# Patient Record
Sex: Male | Born: 1935 | Race: White | Hispanic: No | Smoking: Never smoker
Health system: Southern US, Community
[De-identification: ages and names within clinical notes are randomized; demographics above are authoritative.]

## PROBLEM LIST (undated history)

## (undated) DIAGNOSIS — H547 Unspecified visual loss: Secondary | ICD-10-CM

## (undated) DIAGNOSIS — I639 Cerebral infarction, unspecified: Secondary | ICD-10-CM

## (undated) DIAGNOSIS — T8859XA Other complications of anesthesia, initial encounter: Secondary | ICD-10-CM

## (undated) DIAGNOSIS — I10 Essential (primary) hypertension: Secondary | ICD-10-CM

## (undated) DIAGNOSIS — F419 Anxiety disorder, unspecified: Secondary | ICD-10-CM

## (undated) DIAGNOSIS — R739 Hyperglycemia, unspecified: Secondary | ICD-10-CM

## (undated) DIAGNOSIS — T4145XA Adverse effect of unspecified anesthetic, initial encounter: Secondary | ICD-10-CM

## (undated) HISTORY — DX: Unspecified visual loss: H54.7

## (undated) HISTORY — DX: Hyperglycemia, unspecified: R73.9

## (undated) HISTORY — PX: COLONOSCOPY W/ POLYPECTOMY: SHX1380

## (undated) HISTORY — DX: Cerebral infarction, unspecified: I63.9

## (undated) HISTORY — PX: VASECTOMY: SHX75

## (undated) HISTORY — DX: Essential (primary) hypertension: I10

## (undated) HISTORY — PX: INGUINAL HERNIA REPAIR: SUR1180

## (undated) HISTORY — DX: Anxiety disorder, unspecified: F41.9

---

## 1999-05-10 DIAGNOSIS — I639 Cerebral infarction, unspecified: Secondary | ICD-10-CM

## 1999-05-10 HISTORY — DX: Cerebral infarction, unspecified: I63.9

## 2000-03-04 ENCOUNTER — Inpatient Hospital Stay (HOSPITAL_COMMUNITY): Admission: EM | Admit: 2000-03-04 | Discharge: 2000-03-08 | Payer: Self-pay | Admitting: Emergency Medicine

## 2000-03-04 ENCOUNTER — Encounter: Payer: Self-pay | Admitting: Neurology

## 2000-03-08 ENCOUNTER — Inpatient Hospital Stay (HOSPITAL_COMMUNITY)
Admission: RE | Admit: 2000-03-08 | Discharge: 2000-03-21 | Payer: Self-pay | Admitting: Physical Medicine & Rehabilitation

## 2006-08-10 ENCOUNTER — Ambulatory Visit (HOSPITAL_COMMUNITY): Admission: RE | Admit: 2006-08-10 | Discharge: 2006-08-10 | Payer: Self-pay | Admitting: Pulmonary Disease

## 2007-10-17 ENCOUNTER — Emergency Department (HOSPITAL_COMMUNITY): Admission: EM | Admit: 2007-10-17 | Discharge: 2007-10-17 | Payer: Self-pay | Admitting: Emergency Medicine

## 2008-09-05 ENCOUNTER — Emergency Department (HOSPITAL_COMMUNITY): Admission: EM | Admit: 2008-09-05 | Discharge: 2008-09-05 | Payer: Self-pay | Admitting: Emergency Medicine

## 2009-03-17 DIAGNOSIS — I6350 Cerebral infarction due to unspecified occlusion or stenosis of unspecified cerebral artery: Secondary | ICD-10-CM | POA: Insufficient documentation

## 2009-03-17 DIAGNOSIS — R7309 Other abnormal glucose: Secondary | ICD-10-CM | POA: Insufficient documentation

## 2009-03-17 DIAGNOSIS — I10 Essential (primary) hypertension: Secondary | ICD-10-CM | POA: Insufficient documentation

## 2009-03-19 ENCOUNTER — Encounter (INDEPENDENT_AMBULATORY_CARE_PROVIDER_SITE_OTHER): Payer: Self-pay | Admitting: *Deleted

## 2009-03-19 ENCOUNTER — Ambulatory Visit: Payer: Self-pay | Admitting: Cardiology

## 2009-03-19 DIAGNOSIS — F411 Generalized anxiety disorder: Secondary | ICD-10-CM | POA: Insufficient documentation

## 2009-03-19 DIAGNOSIS — R079 Chest pain, unspecified: Secondary | ICD-10-CM | POA: Insufficient documentation

## 2009-03-19 LAB — CONVERTED CEMR LAB
ALT: 12 units/L
BUN: 13 mg/dL
Calcium: 8.8 mg/dL
Cholesterol: 106 mg/dL
Creatinine, Ser: 0.81 mg/dL
GFR calc non Af Amer: >60 mL/min
Glomerular Filtration Rate, Af Am: >60 mL/min/{1.73_m2}
Glucose, Bld: 104 mg/dL
HCT: 45.1 %
HDL: 33 mg/dL
MCV: 93.2 fL
Platelets: 145 10*3/uL
Potassium: 4.2 meq/L
Triglycerides: 54 mg/dL

## 2009-03-26 ENCOUNTER — Encounter (HOSPITAL_COMMUNITY): Admission: RE | Admit: 2009-03-26 | Discharge: 2009-04-25 | Payer: Self-pay | Admitting: Cardiology

## 2009-03-26 ENCOUNTER — Encounter: Payer: Self-pay | Admitting: Cardiology

## 2009-03-26 ENCOUNTER — Ambulatory Visit: Payer: Self-pay | Admitting: Cardiology

## 2009-04-07 ENCOUNTER — Encounter (INDEPENDENT_AMBULATORY_CARE_PROVIDER_SITE_OTHER): Payer: Self-pay | Admitting: *Deleted

## 2009-04-07 ENCOUNTER — Ambulatory Visit: Payer: Self-pay | Admitting: Cardiology

## 2009-05-04 ENCOUNTER — Encounter: Payer: Self-pay | Admitting: Cardiology

## 2009-05-04 LAB — CONVERTED CEMR LAB
CO2: 26 meq/L (ref 19–32)
Calcium: 9.6 mg/dL (ref 8.4–10.5)
Chloride: 106 meq/L (ref 96–112)
Glucose, Bld: 114 mg/dL — ABNORMAL HIGH (ref 70–99)
Potassium: 4.1 meq/L (ref 3.5–5.3)
Sodium: 144 meq/L (ref 135–145)

## 2009-05-05 ENCOUNTER — Encounter: Payer: Self-pay | Admitting: Cardiology

## 2010-08-18 LAB — COMPREHENSIVE METABOLIC PANEL
Alkaline Phosphatase: 109 U/L (ref 39–117)
BUN: 14 mg/dL (ref 6–23)
Chloride: 108 mEq/L (ref 96–112)
Creatinine, Ser: 0.94 mg/dL (ref 0.4–1.5)
Glucose, Bld: 150 mg/dL — ABNORMAL HIGH (ref 70–99)
Potassium: 3.2 mEq/L — ABNORMAL LOW (ref 3.5–5.1)
Total Bilirubin: 0.6 mg/dL (ref 0.3–1.2)
Total Protein: 6.9 g/dL (ref 6.0–8.3)

## 2010-08-18 LAB — CBC
HCT: 44.7 % (ref 39.0–52.0)
Hemoglobin: 15 g/dL (ref 13.0–17.0)
MCV: 93 fL (ref 78.0–100.0)
Platelets: 145 10*3/uL — ABNORMAL LOW (ref 150–400)
RDW: 13.2 % (ref 11.5–15.5)

## 2010-08-18 LAB — DIFFERENTIAL
Basophils Absolute: 0 10*3/uL (ref 0.0–0.1)
Basophils Relative: 0 % (ref 0–1)
Lymphocytes Relative: 40 % (ref 12–46)
Neutro Abs: 3.9 10*3/uL (ref 1.7–7.7)
Neutrophils Relative %: 48 % (ref 43–77)

## 2010-08-18 LAB — POCT CARDIAC MARKERS: Myoglobin, poc: 44.5 ng/mL (ref 12–200)

## 2010-08-18 LAB — PROTIME-INR: INR: 1.1 (ref 0.00–1.49)

## 2010-09-24 NOTE — Discharge Summary (Signed)
[facility]  Patient:    Ronald Bruce, Ronald Bruce                       MRN: [id number] Adm. Date:  [id number] Disc. Date: 03/08/00 Attending:  Erich Montane CC:         Rande Brunt. Thomasena Edis, M.D.   Discharge Summary  For complete details of chief complaint, history of present illness, past medical history and physical examination, please refer to Dr. Dahlia Bailiff admission History and Physical.  HISTORY OF PRESENT ILLNESS:  Ronald Bruce is a 75 year old, right-handed, married male from [place], [place].  He has at least a three-year history of hypertension intermittently treated in the past, but the patient is not currently on any antihypertensives.  He presented with a two-day history of headaches and awoke on the morning of admission because of difficulty with snoring.  He tried to get out of bed and slid to the floor.  He was unable to get off of the floor.  He was taken to the [facility] Emergency Room where he was noticed to have right-sided weakness.  CT scan of the brain initially was normal.  The patient was transferred to [facility] for admission to the stroke service.  PAST MEDICAL HISTORY:  Positive for colon polyps removed x 2 by Dr. Victorino Dike in 1994 and 1996.  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  The patient does not smoke cigarettes.  He does drink some alcohol admitting to about three beers per day.  PHYSICAL EXAMINATION:  VITAL SIGNS:  Blood pressure 180/80, heart rate 75.  NEUROLOGIC:  The patient was alert and oriented.  He followed commands.  THere was no denial of the left side or aphasia.  Full visual fields were noted. This was present in the left eye as he has congenital blindness in the right eye.  There is some droop of the left corner of his mouth versus the right. He had some element of dysarthria.   Tongue deviated to the left.  Motor examination revealed 3/5 weakness in the left arm with 4+/5 strength in the left leg.  Sensory examination was equal bilaterally.  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  He had some evidence of hypertension.  The plan was to admit the patient and place him on heparin, obtain MRI of the brain with MR angiography and 2D echocardiogram.  LABORATORY DATA AND X-RAY FINDINGS:  Admission CBC and differential showed a hemoglobin of 13.9 and hematocrit of 39.6, white blood cell count 5.2 and platelets 151,000, 77 neutrophils, 14 lymphs, 4 monos.  The most recent CBC on October 30, showed hemoglobin 13.4 and hematocrit of 38.3, white count 4.0 and platelets 153,000.  Sedimentation rate was 2 mm per hour.  PTTs were generally within the therapeutic range.  Comprehensive metabolic panel on admission showed a potassium of 3.4, glucose 118, calcium 8.1.  Total protein 5.6 and all other parameters were within normal limits.  Subsequent basic metabolic panel on October 29, showed potassium 3.3, glucose 155.  Serial CPKs were normal at 27, 35 and 30 units/L, CK-MBs negative and troponin I was less than 0.01.  Lipid profile showed a cholesterol of 126, triglycerides 432 and HDL cholesterol of 19 which was low.  Total cholesterol/HDL ratio was  6.6. Urinalysis was unremarkable.  RPR nonreactive.  Chest x-ray on October 27, showed cardiomegaly with no active disease.  MRI of the brain on October 27, showed an acute moderate size nonhemorrhagic infarct involving the posterior superior aspect of the right lenticular nucleus in the mid to posterior aspect of the right corona radiata.  There was baseline small vessel disease type changes which were mild to moderate in degree.  MR angiography showed no evidence of hemodynamically significant stenosis involving either carotid  bifurcation.  Intracranial MRA showed a poor quality study, most likely related to the patients dental work.  There was apparent narrowing along the genu just proximal to the horizontal segment of the internal carotid artery bilaterally, question artifact.  There was other evidence suggesting mild intracranial arteriosclerotic disease.  HOSPITAL COURSE:  The patient was admitted to the neurologic science unit. Initially, blood pressures were somewhat elevated in the range of 180-185 systolic.  These gradually came down within the range of 130-160 systolic with diastolics of 75-90.  The patient remained febrile.  His p.o. intake was normal on a regular diet.  The patient was started on heparin and continued on heparin throughout this brief hospitalization.  He underwent the above-mentioned studies without difficulty.  A 2D echocardiogram was obtained, but results are pending at this time.  Clinically, the patient remained alert and oriented.  His speech was essentially normal with slight dysarthria.  He seemed to have some increase in weakness in the left upper extremity initially following admission and at that time of this dictation has 2-3/5 weakness proximally in the left upper extremity with 0-2/5 strength distally.  There is 4-4+/5 weakness involving his left lower extremity.  He is normal on the right side.  The patient was up ambulating, but did in fact fall at one point without injury.  He was seen by the stroke team and felt to have no swallowing difficulties per speech therapy.  He has been evaluated by physical therapy.  He is felt to be an excellent rehabilitation candidate and was seen by Dr. Thomasena Edis in rehabilitation consultation.  Dr. Thomasena Edis agreed that inpatient rehabilitation would be helpful.  On October 31, a bed became available on rehabilitation unit.  As noted above, the patient remains on heparin pending the results of his 2D echocardiogram.  If this study is  negative for an embolic source, the patient most likely would best be switched to aspirin therapy.  DISCHARGE DIAGNOSES:  1. Acute right brain subcortical cerebrovascular accident. 2. Hypertension, fairly well controlled off of medication. 3. History of colon polyps, removed x 2.  DISCHARGE MEDICATIONS: 1. Pepcid 20 mg IV q.12h. 2. Heparin therapy.  DISPOSITION:  The patient is transferred to the inpatient rehabilitation unit.  CONDITION ON DISCHARGE:  Stable.  PROGNOSIS:  Fair to good. DD:  03/08/00 TD:  03/08/00 Job: [id number]

## 2010-09-24 NOTE — Discharge Summary (Signed)
[place]  Patient:    Ronald Bruce, Ronald Bruce                       MRN: [id number] Adm. Date:  [id number] Disc. Date: [id number] Attending:  Herold Harms Dictator:   Dian Situ, PA CC:         Patrica Duel, M.D.  Genene Churn. Love, M.D.   Discharge Summary  DISCHARGE DIAGNOSES: 1. Status post cerebrovascular accident. 2. Hypertension. 3. Abnormal liver function tests, resolved. 4. Hyperglycemia, initially resolved.  HISTORY OF PRESENT ILLNESS:  Mr. Ronald Bruce is a 75 year old male with a history of congenital blindness, OD, and hypertension.  He quit medicines years ago.  Admitted with a two-day history of headache and difficulty with ambulation and falls.  He was initially admitted to [facility] on March 04, 2000, and transferred to [facility] for further treatment.  MRI and MRA showed acute infarct in the right lenticular and inferior right corona radiata.  Currently the patient remains on heparin q.d.  Echocardiogram negative for thrombus.  Neurology recommends aspirin alone for CVA prophylaxis.  A bedside swallow showed no signs of dysphagia.  The patient is on a regular diet with thin liquids.  He is current minimal assist for transfers, minimal assist to ambulate 30 feet with queues.  PAST MEDICAL HISTORY:  Significant for a history of colon polyps, blindness in the right eye, vasectomy, right inguinal hernia, and hypertension.  ALLERGIES:  No known drug allergies.  SOCIAL HISTORY:  The patient is married.  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  He does not use any tobacco.  He drinks at least a six-pack of beer a day.  HOSPITAL COURSE:  Mr. Ronald Bruce was admitted to rehabilitation on March 08, 2000, for inpatient therapies to consist of PT and OT daily.  Post admission, he has been maintained on Coumadin for CVA prophylaxis.  Blood pressures were  monitored on a b.i.d. basis and were noted to be ranging upwards.  Therefore, he was started on low-dose Toprol.  At the time of discharge, the blood pressure was reasonably controlled, ranging from 120s-140 systolic and 70s-80s diastolic.  Labs were checked at admission and the patient was noted to have some elevation of AST to 48 and ALT to 61.  Recheck showed resolution.  Check of labs on March 13, 2000, showed sodium 141, potassium 3.7, chloride 106, CO2 29, BUN 11, creatinine 0.8, glucose 128, AST 18, and ALT 38.  CBGs were checked on a q.i.d. basis initially secondary to some mild elevation on early a.m. blood sugars.  He was noted to have an occasional high to the 130s.  A hemoglobin A1C was checked and was noted to be below normal range at 3.4.  CBG checks were discontinued.  Currently the patient is on a regular diet.  The patients left lower extremity is somewhat improved in strength.  The left lower extremity remains pruritus.  The patient is aware of restrictions for driving and hunting post discharge.  By the time of discharge, he has progressed to being at supervision level for ADL needs. He is a supervision for transfers and supervision for ambulating 200+ feet without an assistive device.  He is able to climb five stairs with minimal assist at supervision level.  The family is to provide 24-hour supervision. The patient is to continue with progressive therapies, including PT and OT at  the [facility] post discharge.  On March 21, 2000, the patient is discharged to home.  DISCHARGE MEDICATIONS: 1. Coated 325 mg per day. 2. Tenormin 25 mg per day. 3. Multivitamins one per day.  ACTIVITY:  24-hour supervision.  DIET:  Regular.  SPECIAL INSTRUCTIONS:  No alcohol, no smoking, and no driving.  FOLLOW-UP:  The patient is to follow up with Reuel Boom L. Thomasena Edis, M.D., in one month for recheck.  Follow up with Genene Churn. Love, M.D., and Patrica Duel, M.D.,  in three to four weeks for recheck. DD:  03/21/00 TD:  03/21/00 Job: [id number]

## 2011-02-03 LAB — CBC
MCHC: 35
MCV: 90.3
Platelets: 128 — ABNORMAL LOW
RDW: 13.2

## 2011-02-03 LAB — BASIC METABOLIC PANEL
BUN: 10
CO2: 27
Chloride: 108
Creatinine, Ser: 1.05
Glucose, Bld: 172 — ABNORMAL HIGH

## 2011-02-03 LAB — DIFFERENTIAL
Basophils Absolute: 0
Basophils Relative: 0
Monocytes Absolute: 0.4
Monocytes Relative: 9
Neutrophils Relative %: 65

## 2011-02-03 LAB — POCT CARDIAC MARKERS
Myoglobin, poc: 58.1
Operator id: 213931

## 2011-04-18 ENCOUNTER — Encounter: Payer: Self-pay | Admitting: Cardiology

## 2012-11-05 ENCOUNTER — Emergency Department (HOSPITAL_COMMUNITY): Payer: Medicare Other

## 2012-11-05 ENCOUNTER — Encounter (HOSPITAL_COMMUNITY): Payer: Self-pay | Admitting: *Deleted

## 2012-11-05 ENCOUNTER — Emergency Department (HOSPITAL_COMMUNITY)
Admission: EM | Admit: 2012-11-05 | Discharge: 2012-11-05 | Disposition: A | Discharge status: Home / Self Care | Payer: Medicare Other | Attending: Emergency Medicine | Admitting: Emergency Medicine

## 2012-11-05 DIAGNOSIS — R6883 Chills (without fever): Secondary | ICD-10-CM | POA: Insufficient documentation

## 2012-11-05 DIAGNOSIS — F411 Generalized anxiety disorder: Secondary | ICD-10-CM | POA: Insufficient documentation

## 2012-11-05 DIAGNOSIS — Y9289 Other specified places as the place of occurrence of the external cause: Secondary | ICD-10-CM | POA: Insufficient documentation

## 2012-11-05 DIAGNOSIS — S20219A Contusion of unspecified front wall of thorax, initial encounter: Secondary | ICD-10-CM | POA: Insufficient documentation

## 2012-11-05 DIAGNOSIS — W010XXA Fall on same level from slipping, tripping and stumbling without subsequent striking against object, initial encounter: Secondary | ICD-10-CM | POA: Insufficient documentation

## 2012-11-05 DIAGNOSIS — Z862 Personal history of diseases of the blood and blood-forming organs and certain disorders involving the immune mechanism: Secondary | ICD-10-CM | POA: Insufficient documentation

## 2012-11-05 DIAGNOSIS — H5359 Other color vision deficiencies: Secondary | ICD-10-CM | POA: Insufficient documentation

## 2012-11-05 DIAGNOSIS — Z79899 Other long term (current) drug therapy: Secondary | ICD-10-CM | POA: Insufficient documentation

## 2012-11-05 DIAGNOSIS — Y9389 Activity, other specified: Secondary | ICD-10-CM | POA: Insufficient documentation

## 2012-11-05 DIAGNOSIS — Z7982 Long term (current) use of aspirin: Secondary | ICD-10-CM | POA: Insufficient documentation

## 2012-11-05 DIAGNOSIS — Z8673 Personal history of transient ischemic attack (TIA), and cerebral infarction without residual deficits: Secondary | ICD-10-CM | POA: Insufficient documentation

## 2012-11-05 DIAGNOSIS — W19XXXA Unspecified fall, initial encounter: Secondary | ICD-10-CM

## 2012-11-05 DIAGNOSIS — R0602 Shortness of breath: Secondary | ICD-10-CM | POA: Insufficient documentation

## 2012-11-05 DIAGNOSIS — S59909A Unspecified injury of unspecified elbow, initial encounter: Secondary | ICD-10-CM | POA: Insufficient documentation

## 2012-11-05 DIAGNOSIS — I10 Essential (primary) hypertension: Secondary | ICD-10-CM | POA: Insufficient documentation

## 2012-11-05 DIAGNOSIS — Z7902 Long term (current) use of antithrombotics/antiplatelets: Secondary | ICD-10-CM | POA: Insufficient documentation

## 2012-11-05 DIAGNOSIS — M549 Dorsalgia, unspecified: Secondary | ICD-10-CM | POA: Insufficient documentation

## 2012-11-05 DIAGNOSIS — Z8639 Personal history of other endocrine, nutritional and metabolic disease: Secondary | ICD-10-CM | POA: Insufficient documentation

## 2012-11-05 DIAGNOSIS — IMO0002 Reserved for concepts with insufficient information to code with codable children: Secondary | ICD-10-CM | POA: Insufficient documentation

## 2012-11-05 DIAGNOSIS — S6990XA Unspecified injury of unspecified wrist, hand and finger(s), initial encounter: Secondary | ICD-10-CM | POA: Insufficient documentation

## 2012-11-05 DIAGNOSIS — T07XXXA Unspecified multiple injuries, initial encounter: Secondary | ICD-10-CM

## 2012-11-05 DIAGNOSIS — T148XXA Other injury of unspecified body region, initial encounter: Secondary | ICD-10-CM

## 2012-11-05 MED ORDER — TRAMADOL HCL 50 MG PO TABS: 50.0000 mg | ORAL_TABLET | Freq: Four times a day (QID) | ORAL | Status: DC | PRN

## 2012-11-05 NOTE — ED Provider Notes (Signed)
History    This chart was scribed for Shelda Jakes, MD by Donne Anon, ED Scribe. This patient was seen in room APA14/APA14 and the patient's care was started at 1315.  CSN: [id number] Arrival date & time 11/05/12  1222  First MD Initiated Contact with Patient 11/05/12 1315     Chief Complaint  Patient presents with  . Fall    Patient is a 77 y.o. male presenting with fall. The history is provided by the patient and the spouse. No language interpreter was used.  Fall This is a new problem. The current episode started 1 to 2 hours ago. The problem occurs constantly. The problem has not changed since onset.Associated symptoms include chest pain and shortness of breath. Pertinent negatives include no abdominal pain and no headaches. He has tried nothing for the symptoms.   HPI Comments: Ronald Bruce is a 77 y.o. male who presents to the Emergency Department complaining of a fall which occurred 2 hours ago. He was walking, tripped over an object on the ground, extended his left arm to brace himself and landed on a rock on his left hand and left chest with his face landing in a bush. He was dazed and confused after the fall, and reports questionable LOC after he hit the ground. He currently complains of left hand pain, left chest pain that worsens with deep breaths (rated 0/10 at rest, 5/10 with breathing) and right arm bruising. He denies leg pain, neck pain, nausea, vomiting, SOB and fever. He reports residual left sided weakness due to a CVA in 2001.   His PCP is Dr. Margo Aye.  Past Medical History  Diagnosis Date  . Cerebrovascular accident 2001    Right basal glanglia lacunar  . Hyperglycemia   . Hypertension   . Anxiety   . Congenital blindness     Right eye   Past Surgical History  Procedure Laterality Date  . Vasectomy    . Inguinal hernia repair      Right  . Colonoscopy w/ polypectomy     Family History  Problem Relation Age of Onset  . Heart attack Father     History  Substance Use Topics  . Smoking status: Never Smoker   . Smokeless tobacco: Not on file  . Alcohol Use: 25.2 oz/week    42 Cans of beer per week     Comment: 6 beers per day    Review of Systems  Constitutional: Positive for chills. Negative for fever.  HENT: Negative for sore throat, rhinorrhea and neck pain.   Eyes: Negative for visual disturbance.  Respiratory: Positive for shortness of breath. Negative for cough.   Cardiovascular: Positive for chest pain and leg swelling (left leg).  Gastrointestinal: Negative for nausea, vomiting, abdominal pain and diarrhea.  Genitourinary: Negative for dysuria and hematuria.  Musculoskeletal: Positive for back pain.  Skin: Negative for rash.  Neurological: Negative for dizziness, light-headedness and headaches.  Hematological: Bruises/bleeds easily.  Psychiatric/Behavioral: Negative for confusion.    Allergies  Losartan potassium-hctz  Home Medications   Current Outpatient Rx  Name  Route  Sig  Dispense  Refill  . amLODipine-valsartan (EXFORGE) 10-320 MG per tablet   Oral   Take 1 tablet by mouth daily.           Marland Kitchen aspirin 81 MG tablet   Oral   Take 81 mg by mouth daily.           . chlorthalidone (HYGROTON) 25 MG tablet  Oral   Take 25 mg by mouth daily.           . cloNIDine (CATAPRES) 0.1 MG tablet   Oral   Take 0.1 mg by mouth daily.           . clopidogrel (PLAVIX) 75 MG tablet   Oral   Take 75 mg by mouth daily.           . nebivolol (BYSTOLIC) 10 MG tablet   Oral   Take 20 mg by mouth daily.           . traMADol (ULTRAM) 50 MG tablet   Oral   Take 1 tablet (50 mg total) by mouth every 6 (six) hours as needed.   20 tablet   0    BP 162/76  Pulse 67  Temp(Src) 98.4 F (36.9 C) (Oral)  Resp 16  Ht 5' 10.5" (1.791 m)  Wt 194 lb (87.998 kg)  BMI 27.43 kg/m2  SpO2 98%  Physical Exam  Nursing note and vitals reviewed. Constitutional: He is oriented to person, place, and time.  He appears well-developed and well-nourished. No distress.  HENT:  Head: Normocephalic and atraumatic.  Eyes: Conjunctivae are normal.  Neck: Neck supple. No tracheal deviation present.  Cardiovascular: Normal rate, regular rhythm and normal heart sounds.   Pulmonary/Chest: Effort normal and breath sounds normal. No respiratory distress. He has no wheezes. He has no rales. He exhibits tenderness (left lower and lateral area).  No crepitance on left side of chest.  Abdominal: Soft. Bowel sounds are normal. He exhibits no distension and no mass. There is no tenderness. There is no rebound and no guarding.  Musculoskeletal: Normal range of motion.  Scattered bruising on both forearms. Swelling of left hand, a few superficial skin tears, non that are bigger than 2 cm. Full passive ROM at MP joint, PIP and DIP. Cap refil in left and right hand is 2 seconds. Good flexion and extension of the left hand.  Lymphadenopathy:    He has no cervical adenopathy.  Neurological: He is alert and oriented to person, place, and time. No cranial nerve deficit. He exhibits normal muscle tone. Coordination normal.  Skin: Skin is warm and dry.  Psychiatric: He has a normal mood and affect. His behavior is normal.    ED Course  Procedures (including critical care time) DIAGNOSTIC STUDIES: Oxygen Saturation is 98% on RA, normal by my interpretation.    COORDINATION OF CARE: 1:40 PM Discussed treatment plan which includes reviewing xrays with pt at bedside and pt agreed to plan.  Results for orders placed in visit on 05/04/09  CONVERTED [facility] LAB      Result Value Range   Sodium 144  135-145 meq/L   Potassium 4.1  3.5-5.3 meq/L   Chloride 106  96-112 meq/L   CO2 26  19-32 meq/L   Glucose, Bld 114 (*) 70-99 mg/dL   BUN 19  1-61 mg/dL   Creatinine, Ser 0.96  0.40-1.50 mg/dL   Calcium 9.6  0.4-54.0 mg/dL   Dg Ribs Unilateral W/chest Left  11/05/2012   *RADIOLOGY REPORT*  Clinical Data: 77 year old male  status post fall with pain.  LEFT RIBS AND CHEST - 3+ VIEW  Comparison: 10/17/2007.  Findings: Stable lung volumes.  Cardiac size and mediastinal contours are within normal limits.  Visualized tracheal air column is within normal limits.  No pneumothorax, pulmonary edema, pleural effusion or confluent pulmonary opacity.  Left rib views.  BB marker placed at the  level of the lateral left tenth rib.  No displaced left rib fracture identified.  No acute osseous abnormality identified.  IMPRESSION: 1.  No displaced left rib fracture identified. 2. No acute cardiopulmonary abnormality.   Original Report Authenticated By: Erskine Speed, M.D.   Dg Hand Complete Left  11/05/2012   *RADIOLOGY REPORT*  Clinical Data: Fall, left hand pain  LEFT HAND - COMPLETE 3+ VIEW  Comparison: None.  Findings: Three views of the left hand submitted.  There is diffuse osteopenia.  Degenerative changes are noted distal third metacarpal with erosion and bony spurring.  There is narrowing of the third metacarpal phalangeal joint.  Degenerative changes are noted interphalangeal joint of the thumb.  Degenerative changes are noted first carpal metacarpal joint.  Mild degenerative changes interphalangeal joints of the fifth finger. No acute fracture or subluxation.  There is soft tissue swelling dorsal distal metacarpal region.  IMPRESSION: No acute fracture or subluxation.  Diffuse osteopenia.  Soft tissue swelling dorsal metacarpal region.  Degenerative changes as described above.   Original Report Authenticated By: Natasha Mead, M.D.       1. Fall, initial encounter   2. Contusion   3. Abrasions of multiple sites     MDM  Patient status post fall chest x-ray with left rib series left hand x-rays and CT of head without any acute bony abnormalities are in a cranial acute injury. Patient does have a lot of soft tissue bruising and some abrasions. Patient can followup with orthopedics referral provided at the left hand seems not to  improve over the next few days. Patient does have good range of motion of that left hand has had a stroke on that side so there is some baseline abnormalities 2 strength. But he can make a fist he can straighten the finger out x-rays negative sensation intact.  I personally performed the services described in this documentation, which was scribed in my presence. The recorded information has been reviewed and is accurate.     Shelda Jakes, MD 11/05/12 [phone]

## 2012-11-05 NOTE — ED Notes (Addendum)
Larey Seat this am when out side.  Lt hand swollen , pain lt ribs, ,abrasions to lt forearm.  Alert, talking  Contusions to both knees, superficial scratch to chin

## 2012-11-05 NOTE — ED Notes (Signed)
Patient tripped over rocks along walkway falling into [place].  Hit L dorsum of hand on rock as well as lower L ribcage.  Pain in ribcage only with deep breath.  No c/o hand pain.  Large hematoma over lateral aspect of dorsum L hand.  No bruising noted on chest or back.

## 2013-07-07 DEATH — deceased

## 2014-06-13 DIAGNOSIS — E782 Mixed hyperlipidemia: Secondary | ICD-10-CM | POA: Diagnosis not present

## 2014-06-13 DIAGNOSIS — R7301 Impaired fasting glucose: Secondary | ICD-10-CM | POA: Diagnosis not present

## 2014-06-13 DIAGNOSIS — I10 Essential (primary) hypertension: Secondary | ICD-10-CM | POA: Diagnosis not present

## 2014-06-17 DIAGNOSIS — R7301 Impaired fasting glucose: Secondary | ICD-10-CM | POA: Diagnosis not present

## 2014-06-17 DIAGNOSIS — E782 Mixed hyperlipidemia: Secondary | ICD-10-CM | POA: Diagnosis not present

## 2014-06-17 DIAGNOSIS — I10 Essential (primary) hypertension: Secondary | ICD-10-CM | POA: Diagnosis not present

## 2014-07-17 DIAGNOSIS — H5200 Hypermetropia, unspecified eye: Secondary | ICD-10-CM | POA: Diagnosis not present

## 2014-07-17 DIAGNOSIS — H5210 Myopia, unspecified eye: Secondary | ICD-10-CM | POA: Diagnosis not present

## 2014-11-11 DIAGNOSIS — E782 Mixed hyperlipidemia: Secondary | ICD-10-CM | POA: Diagnosis not present

## 2014-11-11 DIAGNOSIS — E039 Hypothyroidism, unspecified: Secondary | ICD-10-CM | POA: Diagnosis not present

## 2014-11-11 DIAGNOSIS — R7301 Impaired fasting glucose: Secondary | ICD-10-CM | POA: Diagnosis not present

## 2014-11-11 DIAGNOSIS — I10 Essential (primary) hypertension: Secondary | ICD-10-CM | POA: Diagnosis not present

## 2014-11-14 DIAGNOSIS — R809 Proteinuria, unspecified: Secondary | ICD-10-CM | POA: Diagnosis not present

## 2014-11-14 DIAGNOSIS — R7301 Impaired fasting glucose: Secondary | ICD-10-CM | POA: Diagnosis not present

## 2014-11-14 DIAGNOSIS — I10 Essential (primary) hypertension: Secondary | ICD-10-CM | POA: Diagnosis not present

## 2014-11-14 DIAGNOSIS — E782 Mixed hyperlipidemia: Secondary | ICD-10-CM | POA: Diagnosis not present

## 2014-11-16 IMAGING — CR DG RIBS W/ CHEST 3+V*L*
4 series · 4 of 4 positions shown · non-contrast
Comparison: 10/17/2007.

CLINICAL DATA: 76-year-old male status post fall with pain.

LEFT RIBS AND CHEST - 3+ VIEW

[view not recorded (1 of 4)]
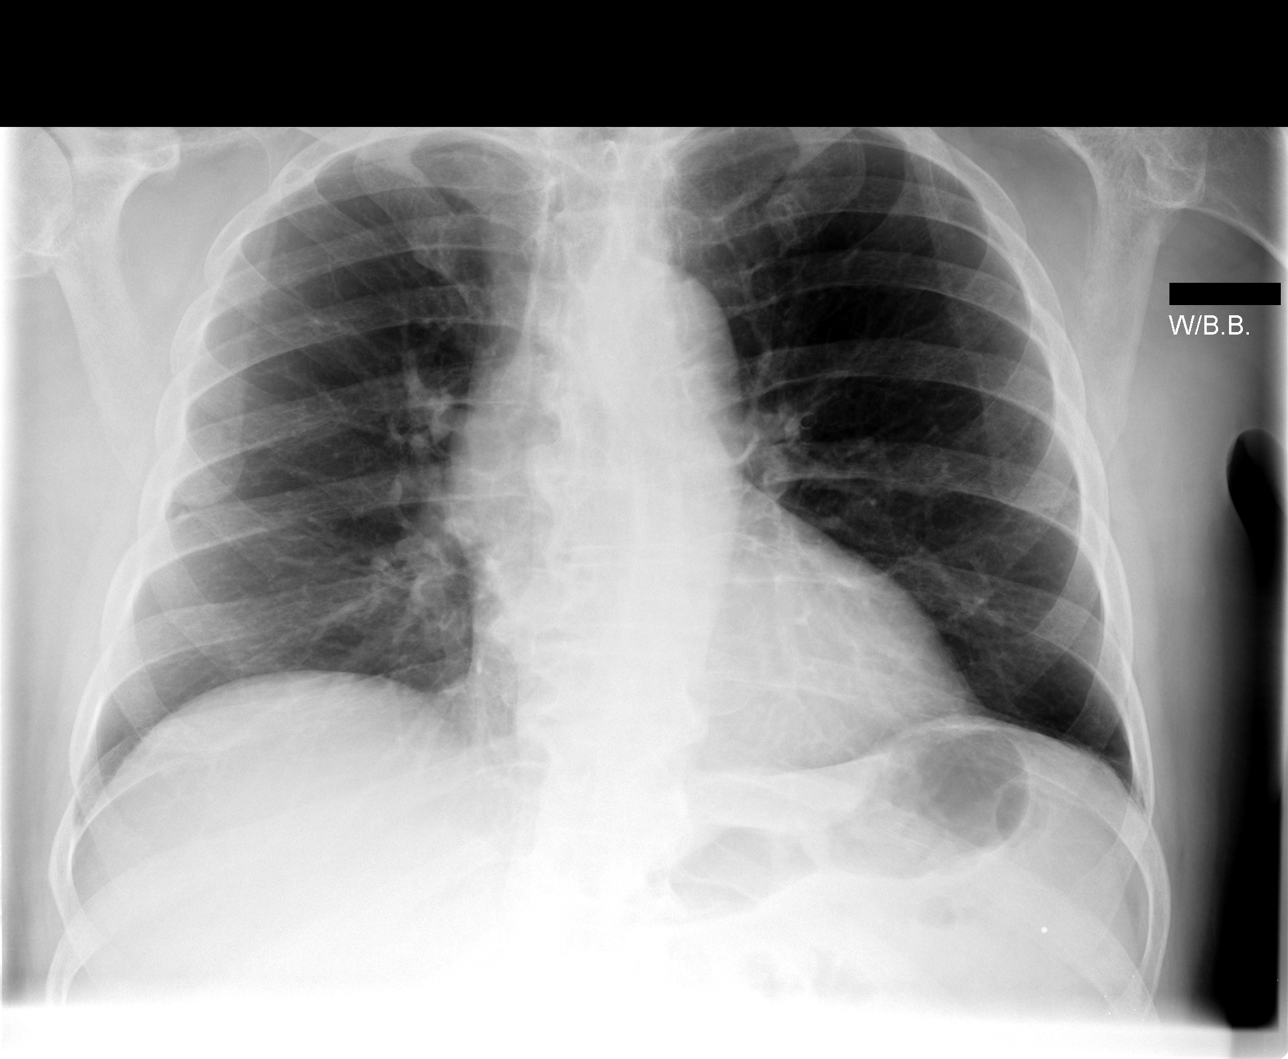

[view not recorded (2 of 4)]
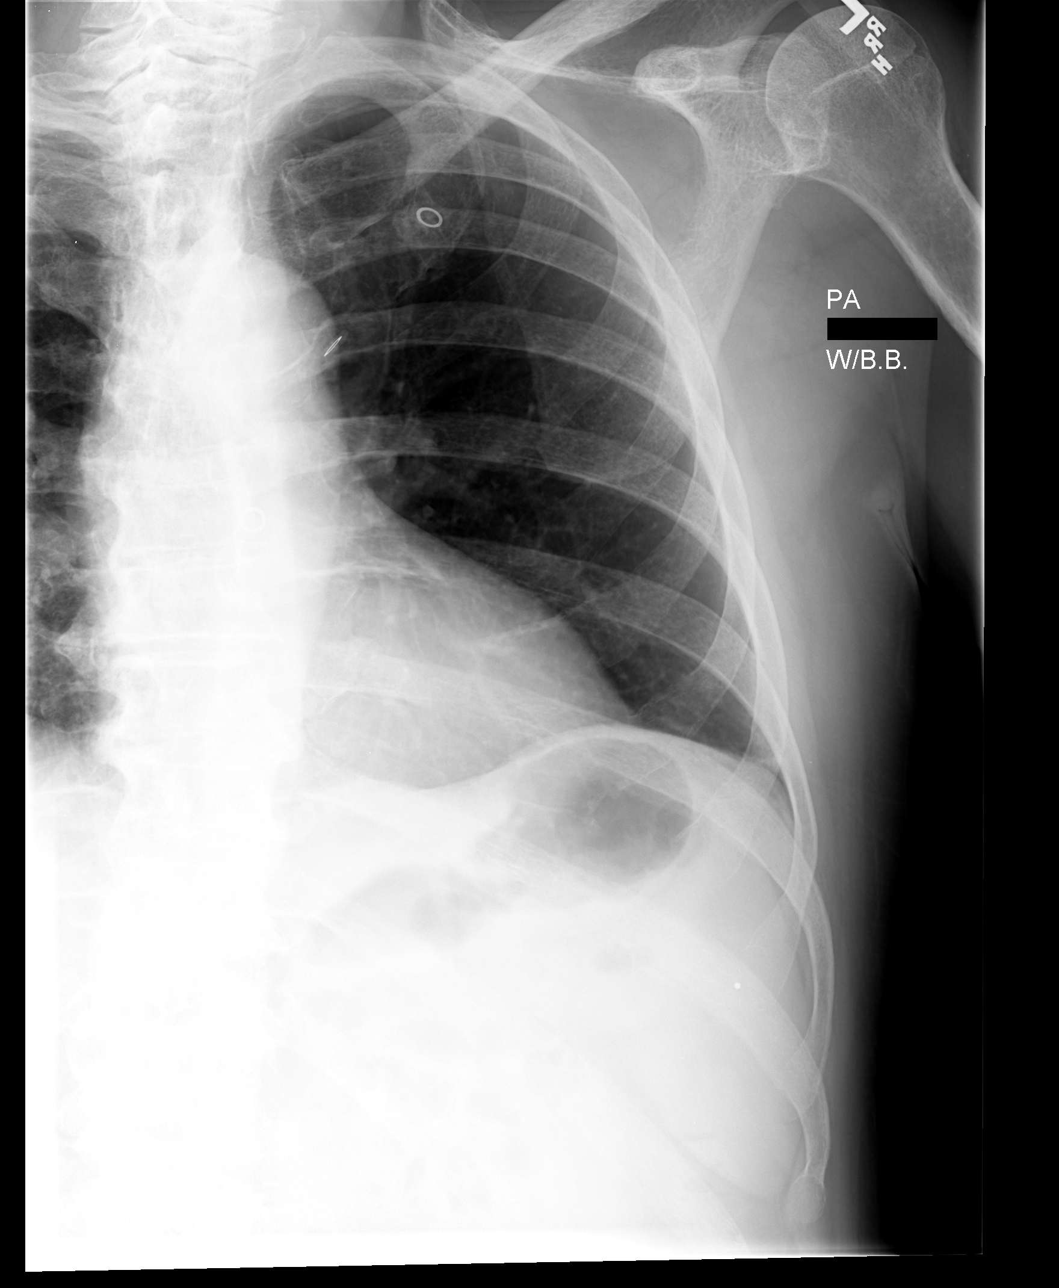

[view not recorded (3 of 4)]
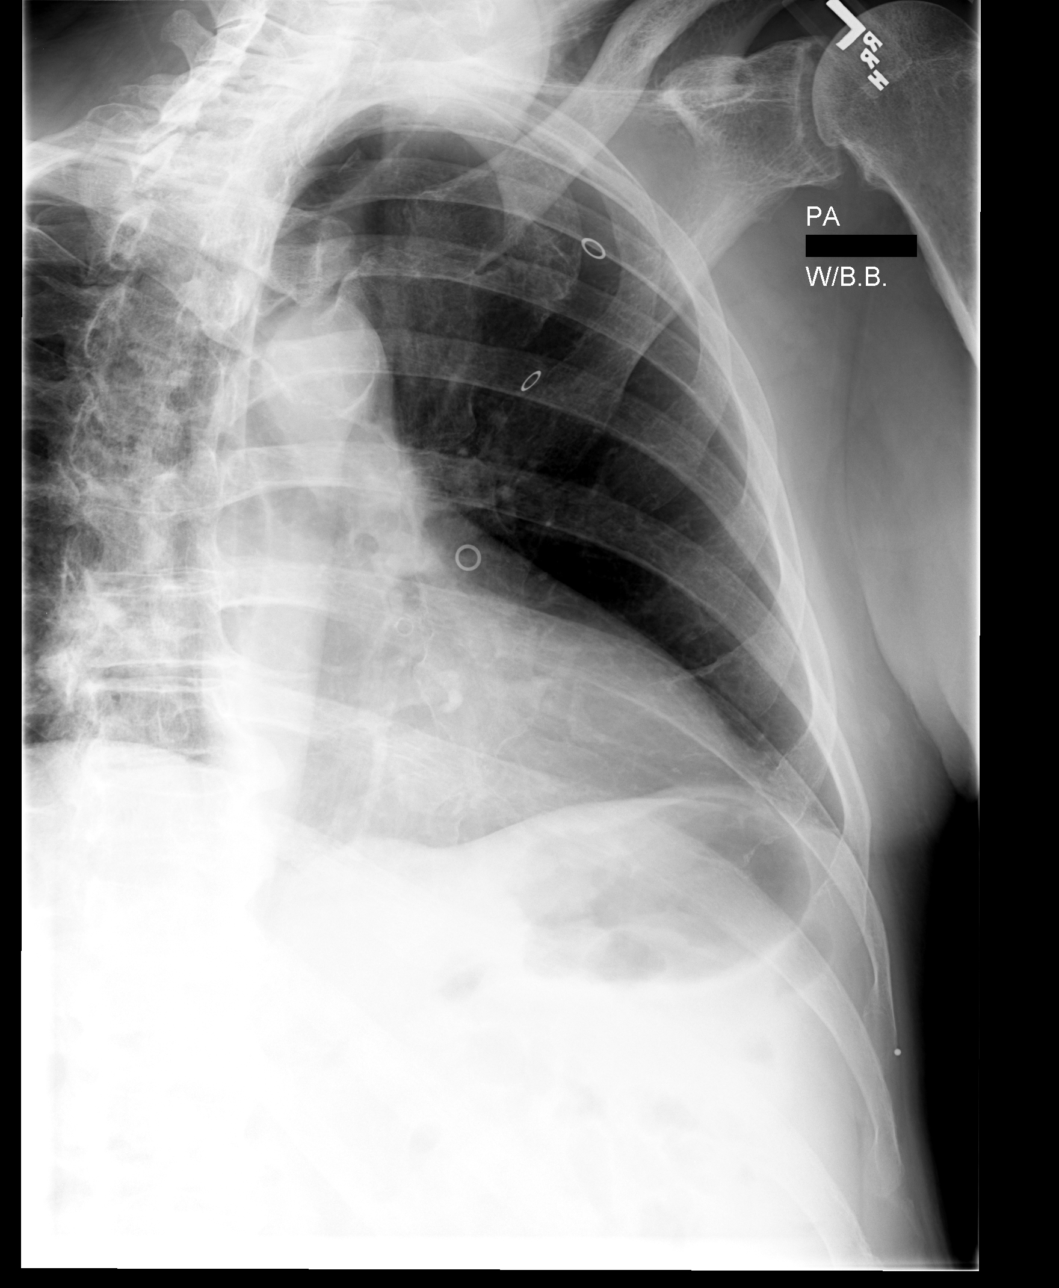

[view not recorded (4 of 4)]
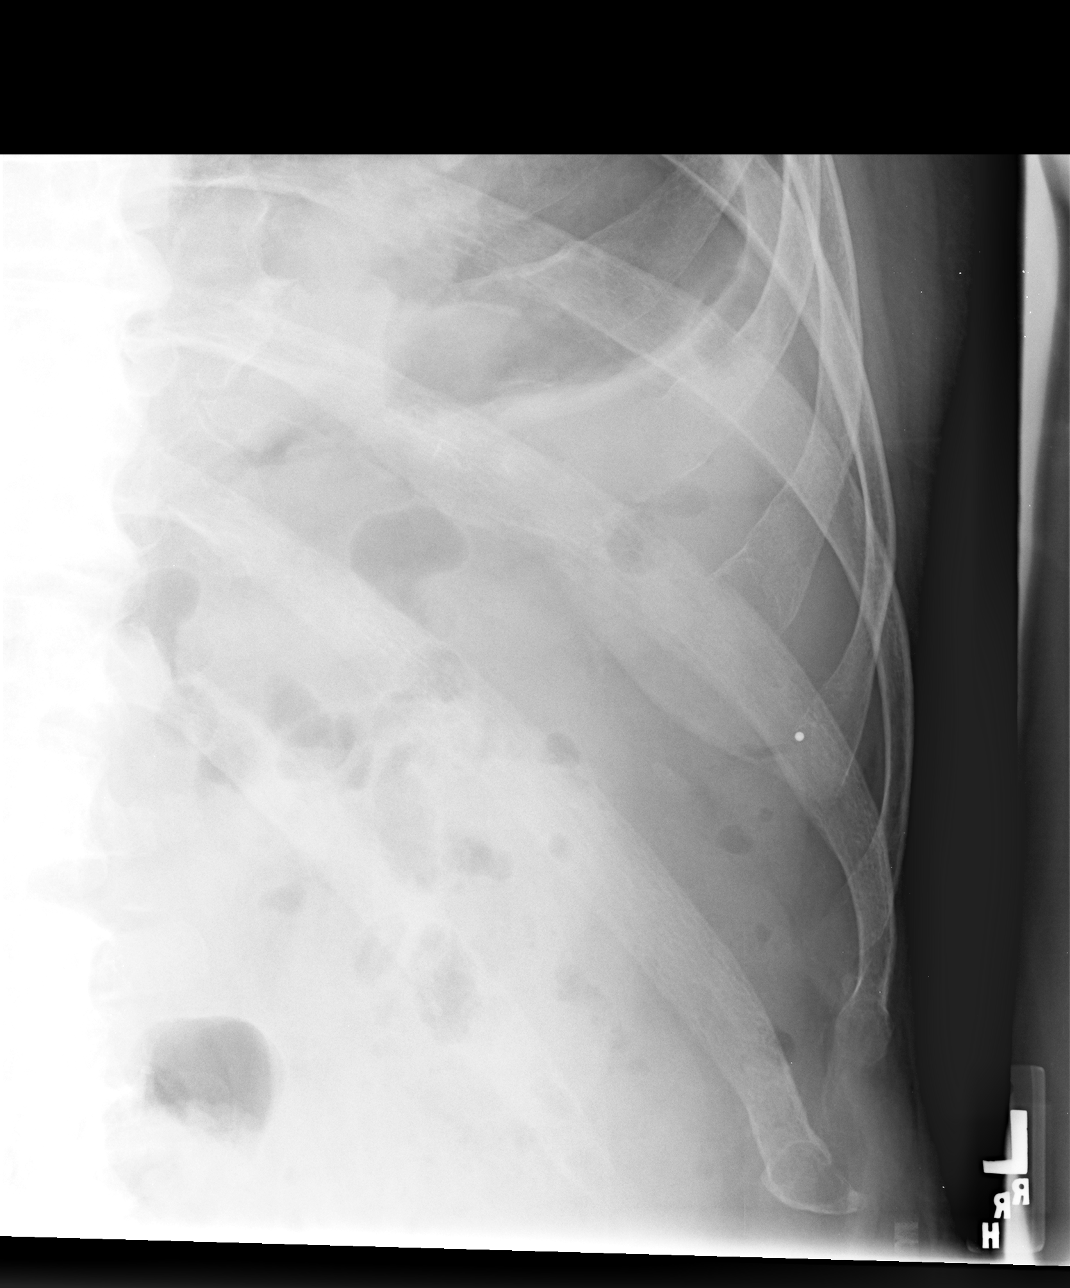

[4 of 4 positions shown; findings below may reference images not displayed]

FINDINGS: Stable lung volumes.  Cardiac size and mediastinal
contours are within normal limits.  Visualized tracheal air column
is within normal limits.  No pneumothorax, pulmonary edema, pleural
effusion or confluent pulmonary opacity.

Left rib views.  BB marker placed at the level of the lateral left
tenth rib.  No displaced left rib fracture identified.  No acute
osseous abnormality identified.
IMPRESSION: 1.  No displaced left rib fracture identified.
2. No acute cardiopulmonary abnormality.

## 2015-01-29 DIAGNOSIS — I10 Essential (primary) hypertension: Secondary | ICD-10-CM | POA: Diagnosis not present

## 2015-01-29 DIAGNOSIS — R600 Localized edema: Secondary | ICD-10-CM | POA: Diagnosis not present

## 2015-01-29 DIAGNOSIS — R531 Weakness: Secondary | ICD-10-CM | POA: Diagnosis not present

## 2015-01-29 DIAGNOSIS — N401 Enlarged prostate with lower urinary tract symptoms: Secondary | ICD-10-CM | POA: Diagnosis not present

## 2015-02-26 DIAGNOSIS — E782 Mixed hyperlipidemia: Secondary | ICD-10-CM | POA: Diagnosis not present

## 2015-02-26 DIAGNOSIS — R7301 Impaired fasting glucose: Secondary | ICD-10-CM | POA: Diagnosis not present

## 2015-03-23 DIAGNOSIS — R600 Localized edema: Secondary | ICD-10-CM | POA: Diagnosis not present

## 2015-03-23 DIAGNOSIS — R531 Weakness: Secondary | ICD-10-CM | POA: Diagnosis not present

## 2015-03-23 DIAGNOSIS — F339 Major depressive disorder, recurrent, unspecified: Secondary | ICD-10-CM | POA: Diagnosis not present

## 2015-03-23 DIAGNOSIS — N400 Enlarged prostate without lower urinary tract symptoms: Secondary | ICD-10-CM | POA: Diagnosis not present

## 2015-07-21 DIAGNOSIS — H52222 Regular astigmatism, left eye: Secondary | ICD-10-CM | POA: Diagnosis not present

## 2015-07-21 DIAGNOSIS — H25812 Combined forms of age-related cataract, left eye: Secondary | ICD-10-CM | POA: Diagnosis not present

## 2015-07-21 DIAGNOSIS — H2511 Age-related nuclear cataract, right eye: Secondary | ICD-10-CM | POA: Diagnosis not present

## 2015-07-21 DIAGNOSIS — H5203 Hypermetropia, bilateral: Secondary | ICD-10-CM | POA: Diagnosis not present

## 2015-08-25 DIAGNOSIS — R7301 Impaired fasting glucose: Secondary | ICD-10-CM | POA: Diagnosis not present

## 2015-08-25 DIAGNOSIS — E782 Mixed hyperlipidemia: Secondary | ICD-10-CM | POA: Diagnosis not present

## 2015-08-25 DIAGNOSIS — I10 Essential (primary) hypertension: Secondary | ICD-10-CM | POA: Diagnosis not present

## 2015-09-03 DIAGNOSIS — I10 Essential (primary) hypertension: Secondary | ICD-10-CM | POA: Diagnosis not present

## 2015-09-03 DIAGNOSIS — F339 Major depressive disorder, recurrent, unspecified: Secondary | ICD-10-CM | POA: Diagnosis not present

## 2015-09-15 DIAGNOSIS — H2512 Age-related nuclear cataract, left eye: Secondary | ICD-10-CM | POA: Diagnosis not present

## 2015-09-15 DIAGNOSIS — H53021 Refractive amblyopia, right eye: Secondary | ICD-10-CM | POA: Diagnosis not present

## 2015-09-15 DIAGNOSIS — H25012 Cortical age-related cataract, left eye: Secondary | ICD-10-CM | POA: Diagnosis not present

## 2015-09-21 NOTE — Patient Instructions (Signed)
Your procedure is scheduled on: 09/29/2015  [facility] to [facility] at  700  AM.  Call this number if you have problems the morning of surgery: [phone]   Do not eat food or drink liquids :After Midnight.      Take these medicines the morning of surgery with A SIP OF WATER: amlodipine, catapress, bystolic, ultram.   Do not wear jewelry, make-up or nail polish.  Do not wear lotions, powders, or perfumes. You may wear deodorant.  Do not shave 48 hours prior to surgery.  Do not bring valuables to the hospital.  Contacts, dentures or bridgework may not be worn into surgery.  Leave suitcase in the car. After surgery it may be brought to your room.  For patients admitted to the hospital, checkout time is 11:00 AM the day of discharge.   Patients discharged the day of surgery will not be allowed to drive home.  :     Please read over the following fact sheets that you were given: Coughing and Deep Breathing, Surgical Site Infection Prevention, Anesthesia Post-op Instructions and Care and Recovery After Surgery    Cataract A cataract is a clouding of the lens of the eye. When a lens becomes cloudy, vision is reduced based on the degree and nature of the clouding. Many cataracts reduce vision to some degree. Some cataracts make people more near-sighted as they develop. Other cataracts increase glare. Cataracts that are ignored and become worse can sometimes look white. The white color can be seen through the pupil. CAUSES   Aging. However, cataracts may occur at any age, even in newborns.   Certain drugs.   Trauma to the eye.   Certain diseases such as diabetes.   Specific eye diseases such as chronic inflammation inside the eye or a sudden attack of a rare form of glaucoma.   Inherited or acquired medical problems.  SYMPTOMS   Gradual, progressive drop in vision in the affected eye.   Severe, rapid visual loss. This most often happens when trauma is the cause.  DIAGNOSIS  To detect a  cataract, an eye doctor examines the lens. Cataracts are best diagnosed with an exam of the eyes with the pupils enlarged (dilated) by drops.  TREATMENT  For an early cataract, vision may improve by using different eyeglasses or stronger lighting. If that does not help your vision, surgery is the only effective treatment. A cataract needs to be surgically removed when vision loss interferes with your everyday activities, such as driving, reading, or watching TV. A cataract may also have to be removed if it prevents examination or treatment of another eye problem. Surgery removes the cloudy lens and usually replaces it with a substitute lens (intraocular lens, IOL).  At a time when both you and your doctor agree, the cataract will be surgically removed. If you have cataracts in both eyes, only one is usually removed at a time. This allows the operated eye to heal and be out of danger from any possible problems after surgery (such as infection or poor wound healing). In rare cases, a cataract may be doing damage to your eye. In these cases, your caregiver may advise surgical removal right away. The vast majority of people who have cataract surgery have better vision afterward. HOME CARE INSTRUCTIONS  If you are not planning surgery, you may be asked to do the following:  Use different eyeglasses.   Use stronger or brighter lighting.   Ask your eye doctor about reducing your  medicine dose or changing medicines if it is thought that a medicine caused your cataract. Changing medicines does not make the cataract go away on its own.   Become familiar with your surroundings. Poor vision can lead to injury. Avoid bumping into things on the affected side. You are at a higher risk for tripping or falling.   Exercise extreme care when driving or operating machinery.   Wear sunglasses if you are sensitive to bright light or experiencing problems with glare.  SEEK IMMEDIATE MEDICAL CARE IF:   You have a  worsening or sudden vision loss.   You notice redness, swelling, or increasing pain in the eye.   You have a fever.  Document Released: 04/25/2005 Document Revised: 04/14/2011 Document Reviewed: 12/17/2010 [facility] Patient Information 2012 [place].PATIENT INSTRUCTIONS POST-ANESTHESIA  IMMEDIATELY FOLLOWING SURGERY:  Do not drive or operate machinery for the first twenty four hours after surgery.  Do not make any important decisions for twenty four hours after surgery or while taking narcotic pain medications or sedatives.  If you develop intractable nausea and vomiting or a severe headache please notify your doctor immediately.  FOLLOW-UP:  Please make an appointment with your surgeon as instructed. You do not need to follow up with anesthesia unless specifically instructed to do so.  WOUND CARE INSTRUCTIONS (if applicable):  Keep a dry clean dressing on the anesthesia/puncture wound site if there is drainage.  Once the wound has quit draining you may leave it open to air.  Generally you should leave the bandage intact for twenty four hours unless there is drainage.  If the epidural site drains for more than 36-48 hours please call the anesthesia department.  QUESTIONS?:  Please feel free to call your physician or the hospital operator if you have any questions, and they will be happy to assist you.

## 2015-09-22 ENCOUNTER — Encounter (HOSPITAL_COMMUNITY): Payer: Self-pay

## 2015-09-22 ENCOUNTER — Encounter (HOSPITAL_COMMUNITY)
Admission: RE | Admit: 2015-09-22 | Discharge: 2015-09-22 | Disposition: A | Discharge status: Home / Self Care | Payer: Commercial Managed Care - HMO | Source: Ambulatory Visit | Attending: Ophthalmology | Admitting: Ophthalmology

## 2015-09-22 DIAGNOSIS — R9431 Abnormal electrocardiogram [ECG] [EKG]: Secondary | ICD-10-CM | POA: Diagnosis not present

## 2015-09-22 DIAGNOSIS — Z01818 Encounter for other preprocedural examination: Secondary | ICD-10-CM | POA: Diagnosis not present

## 2015-09-22 DIAGNOSIS — H269 Unspecified cataract: Secondary | ICD-10-CM | POA: Insufficient documentation

## 2015-09-22 DIAGNOSIS — Z01812 Encounter for preprocedural laboratory examination: Secondary | ICD-10-CM | POA: Insufficient documentation

## 2015-09-22 HISTORY — DX: Other complications of anesthesia, initial encounter: T88.59XA

## 2015-09-22 HISTORY — DX: Adverse effect of unspecified anesthetic, initial encounter: T41.45XA

## 2015-09-22 LAB — BASIC METABOLIC PANEL
Anion gap: 8 (ref 5–15)
BUN: 18 mg/dL (ref 6–20)
CHLORIDE: 104 mmol/L (ref 101–111)
CO2: 26 mmol/L (ref 22–32)
CREATININE: 0.87 mg/dL (ref 0.61–1.24)
Calcium: 8.9 mg/dL (ref 8.9–10.3)
GFR calc Af Amer: >60 mL/min (ref >60)
GFR calc non Af Amer: >60 mL/min (ref >60)
GLUCOSE: 145 mg/dL — AB (ref 65–99)
Potassium: 4.2 mmol/L (ref 3.5–5.1)
Sodium: 138 mmol/L (ref 135–145)

## 2015-09-22 LAB — CBC WITH DIFFERENTIAL/PLATELET
Basophils Absolute: 0 10*3/uL (ref 0.0–0.1)
Basophils Relative: 1 %
EOS ABS: 0.1 10*3/uL (ref 0.0–0.7)
Eosinophils Relative: 3 %
HEMATOCRIT: 43.7 % (ref 39.0–52.0)
HEMOGLOBIN: 14.5 g/dL (ref 13.0–17.0)
LYMPHS ABS: 1.2 10*3/uL (ref 0.7–4.0)
LYMPHS PCT: 22 %
MCH: 32.9 pg (ref 26.0–34.0)
MCHC: 33.2 g/dL (ref 30.0–36.0)
MCV: 99.1 fL (ref 78.0–100.0)
MONOS PCT: 10 %
Monocytes Absolute: 0.6 10*3/uL (ref 0.1–1.0)
NEUTROS ABS: 3.6 10*3/uL (ref 1.7–7.7)
NEUTROS PCT: 64 %
Platelets: 167 10*3/uL (ref 150–400)
RBC: 4.41 MIL/uL (ref 4.22–5.81)
RDW: 12.5 % (ref 11.5–15.5)
WBC: 5.6 10*3/uL (ref 4.0–10.5)

## 2015-09-29 ENCOUNTER — Ambulatory Visit (HOSPITAL_COMMUNITY)
Admission: RE | Admit: 2015-09-29 | Discharge: 2015-09-29 | Disposition: A | Discharge status: Home / Self Care | Payer: Commercial Managed Care - HMO | Source: Ambulatory Visit | Attending: Ophthalmology | Admitting: Ophthalmology

## 2015-09-29 ENCOUNTER — Ambulatory Visit (HOSPITAL_COMMUNITY): Payer: Commercial Managed Care - HMO | Admitting: Anesthesiology

## 2015-09-29 ENCOUNTER — Encounter (HOSPITAL_COMMUNITY): Payer: Self-pay | Admitting: *Deleted

## 2015-09-29 ENCOUNTER — Encounter (HOSPITAL_COMMUNITY)
Admission: RE | Disposition: A | Discharge status: Home / Self Care | Payer: Self-pay | Source: Ambulatory Visit | Attending: Ophthalmology

## 2015-09-29 DIAGNOSIS — Z8673 Personal history of transient ischemic attack (TIA), and cerebral infarction without residual deficits: Secondary | ICD-10-CM | POA: Diagnosis not present

## 2015-09-29 DIAGNOSIS — Z79899 Other long term (current) drug therapy: Secondary | ICD-10-CM | POA: Insufficient documentation

## 2015-09-29 DIAGNOSIS — F419 Anxiety disorder, unspecified: Secondary | ICD-10-CM | POA: Diagnosis not present

## 2015-09-29 DIAGNOSIS — Z7902 Long term (current) use of antithrombotics/antiplatelets: Secondary | ICD-10-CM | POA: Insufficient documentation

## 2015-09-29 DIAGNOSIS — H2512 Age-related nuclear cataract, left eye: Secondary | ICD-10-CM | POA: Insufficient documentation

## 2015-09-29 DIAGNOSIS — I10 Essential (primary) hypertension: Secondary | ICD-10-CM | POA: Insufficient documentation

## 2015-09-29 DIAGNOSIS — H269 Unspecified cataract: Secondary | ICD-10-CM | POA: Diagnosis not present

## 2015-09-29 DIAGNOSIS — H25012 Cortical age-related cataract, left eye: Secondary | ICD-10-CM | POA: Diagnosis not present

## 2015-09-29 HISTORY — PX: CATARACT EXTRACTION W/PHACO: SHX586

## 2015-09-29 SURGERY — PHACOEMULSIFICATION, CATARACT, WITH IOL INSERTION
Anesthesia: Monitor Anesthesia Care | Site: Eye | Laterality: Left

## 2015-09-29 MED ORDER — EPINEPHRINE HCL 1 MG/ML IJ SOLN
INTRAOCULAR | Status: DC | PRN
  Administered 2015-09-29: 500 mL

## 2015-09-29 MED ORDER — FENTANYL CITRATE (PF) 100 MCG/2ML IJ SOLN
25.0000 ug | INTRAMUSCULAR | Status: AC
  Administered 2015-09-29 (×2): 25 ug via INTRAVENOUS

## 2015-09-29 MED ORDER — FENTANYL CITRATE (PF) 100 MCG/2ML IJ SOLN
INTRAMUSCULAR | Status: AC
  Filled 2015-09-29: qty 2

## 2015-09-29 MED ORDER — BSS IO SOLN
INTRAOCULAR | Status: DC | PRN
  Administered 2015-09-29: 15 mL

## 2015-09-29 MED ORDER — LACTATED RINGERS IV SOLN
INTRAVENOUS | Status: DC | PRN
  Administered 2015-09-29: 08:00:00 via INTRAVENOUS

## 2015-09-29 MED ORDER — MIDAZOLAM HCL 2 MG/2ML IJ SOLN
INTRAMUSCULAR | Status: AC
  Filled 2015-09-29: qty 2

## 2015-09-29 MED ORDER — LACTATED RINGERS IV SOLN
INTRAVENOUS | Status: DC
  Administered 2015-09-29: 1000 mL via INTRAVENOUS

## 2015-09-29 MED ORDER — TETRACAINE 0.5 % OP SOLN OPTIME - NO CHARGE
OPHTHALMIC | Status: DC | PRN
  Administered 2015-09-29: 2 [drp] via OPHTHALMIC

## 2015-09-29 MED ORDER — PROVISC 10 MG/ML IO SOLN
INTRAOCULAR | Status: DC | PRN
  Administered 2015-09-29: 0.85 mL via INTRAOCULAR

## 2015-09-29 MED ORDER — CYCLOPENTOLATE-PHENYLEPHRINE 0.2-1 % OP SOLN
1.0000 [drp] | OPHTHALMIC | Status: AC
  Administered 2015-09-29 (×3): 1 [drp] via OPHTHALMIC

## 2015-09-29 MED ORDER — ONDANSETRON HCL 4 MG/2ML IJ SOLN: 4.0000 mg | Freq: Once | INTRAMUSCULAR | Status: DC | PRN

## 2015-09-29 MED ORDER — TETRACAINE HCL 0.5 % OP SOLN
1.0000 [drp] | OPHTHALMIC | Status: AC
  Administered 2015-09-29 (×3): 1 [drp] via OPHTHALMIC

## 2015-09-29 MED ORDER — MIDAZOLAM HCL 2 MG/2ML IJ SOLN
1.0000 mg | INTRAMUSCULAR | Status: DC | PRN
  Administered 2015-09-29: 2 mg via INTRAVENOUS

## 2015-09-29 MED ORDER — KETOROLAC TROMETHAMINE 0.5 % OP SOLN
1.0000 [drp] | OPHTHALMIC | Status: AC
  Administered 2015-09-29 (×3): 1 [drp] via OPHTHALMIC

## 2015-09-29 MED ORDER — FENTANYL CITRATE (PF) 100 MCG/2ML IJ SOLN: 25.0000 ug | INTRAMUSCULAR | Status: DC | PRN

## 2015-09-29 MED ORDER — PHENYLEPHRINE HCL 2.5 % OP SOLN
1.0000 [drp] | OPHTHALMIC | Status: AC
  Administered 2015-09-29 (×3): 1 [drp] via OPHTHALMIC

## 2015-09-29 SURGICAL SUPPLY — 10 items
CLOTH BEACON ORANGE TIMEOUT ST (SAFETY) ×2 IMPLANT
EYE SHIELD UNIVERSAL CLEAR (GAUZE/BANDAGES/DRESSINGS) ×3 IMPLANT
GLOVE BIO SURGEON STRL SZ 6.5 (GLOVE) ×2 IMPLANT
GLOVE BIO SURGEONS STRL SZ 6.5 (GLOVE) ×1
GLOVE EXAM NITRILE MD LF STRL (GLOVE) ×3 IMPLANT
LENS ALC ACRYL/TECN (Ophthalmic Related) ×3 IMPLANT
PAD ARMBOARD 7.5X6 YLW CONV (MISCELLANEOUS) ×2 IMPLANT
TAPE SURG TRANSPARENT 2IN (GAUZE/BANDAGES/DRESSINGS) ×1 IMPLANT
TAPE TRANSPARENT 2IN (GAUZE/BANDAGES/DRESSINGS) ×2
WATER STERILE IRR 250ML POUR (IV SOLUTION) ×3 IMPLANT

## 2015-09-29 NOTE — Transfer of Care (Signed)
Immediate Anesthesia Transfer of Care Note  Patient: Ronald FredericksonRobert W Bruce  Procedure(s) Performed: Procedure(s) with comments: CATARACT EXTRACTION PHACO AND INTRAOCULAR LENS PLACEMENT (IOC) (Left) - CDE: 7.12  Patient Location: Short Stay  Anesthesia Type:MAC  Level of Consciousness: awake, alert , oriented and patient cooperative  Airway & Oxygen Therapy: Patient Spontanous Breathing  Post-op Assessment: Report given to RN and Post -op Vital signs reviewed and stable  Post vital signs: Reviewed and stable  Last Vitals:  Filed Vitals:   09/29/15 0804 09/29/15 0840  BP: 168/74 162/76  Temp: 36.8 C   Resp: 20 19    Last Pain: There were no vitals filed for this visit.    Patients Stated Pain Goal: 7 (09/29/15 0804)  Complications: No apparent anesthesia complications

## 2015-09-29 NOTE — Anesthesia Procedure Notes (Signed)
Procedure Name: MAC Date/Time: 09/29/2015 8:41 AM Performed by: Pernell DupreADAMS, AMY A Pre-anesthesia Checklist: Patient identified, Timeout performed, Emergency Drugs available, Suction available and Patient being monitored Oxygen Delivery Method: Nasal cannula

## 2015-09-29 NOTE — Discharge Instructions (Signed)
°  °          [facility] Inst[facility] [address]- [place] [id number] [address]      1. Avoid closing eyes tightly. One often closes the eye tightly when laughing, talking, sneezing, coughing or if they feel irritated. At these times, you should be careful not to close your eyes tightly.  2. Instill eye drops as instructed. To instill drops in your eye, open it, look up and have someone gently pull the lower lid down and instill a couple of drops inside the lower lid.  3. Do not touch upper lid.  4. Take Advil or Tylenol for pain.  5. You may use either eye for near work, such as reading or sewing and you may watch television.  6. You may have your hair done at the beauty parlor at any time.  7. Wear dark glasses with or without your own glasses if you are in bright light.  8. Call our office at [phone] or [phone] if you have sharp pain in your eye or unusual symptoms.  9.  FOLLOW UP WITH DR. SHAPIRO TODAY IN HIS [place] OFFICE AT 2:45pm.    I have received a copy of the above instructions and will follow them.     IF YOU ARE IN IMMEDIATE DANGER CALL 911!  It is important for you to keep your follow-up appointment with your physician after discharge, OR, for you /your caregiver to make a follow-up appointment with your physician / medical provider after discharge.  Show these instructions to the next healthcare provider you see.   PLEASE GO TO DR. SHAPIRO'S OFFICE TODAY AT 2:45PM.

## 2015-09-29 NOTE — Anesthesia Preprocedure Evaluation (Signed)
Anesthesia Evaluation  Patient identified by MRN, date of birth, ID band Patient awake    Reviewed: Allergy & Precautions, NPO status , Patient's Chart, lab work & pertinent test results  History of Anesthesia Complications (+) Emergence Delirium and history of anesthetic complications  Airway Mallampati: II  TM Distance: >3 FB     Dental  (+) Poor Dentition   Pulmonary    breath sounds clear to auscultation       Cardiovascular hypertension, Pt. on medications  Rhythm:Regular Rate:Normal     Neuro/Psych PSYCHIATRIC DISORDERS Anxiety CVA    GI/Hepatic (+)     substance abuse  alcohol use,   Endo/Other    Renal/GU      Musculoskeletal   Abdominal   Peds  Hematology   Anesthesia Other Findings   Reproductive/Obstetrics                             Anesthesia Physical Anesthesia Plan  ASA: III  Anesthesia Plan: MAC   Post-op Pain Management:    Induction: Intravenous  Airway Management Planned: Nasal Cannula  Additional Equipment:   Intra-op Plan:   Post-operative Plan:   Informed Consent: I have reviewed the patients History and Physical, chart, labs and discussed the procedure including the risks, benefits and alternatives for the proposed anesthesia with the patient or authorized representative who has indicated his/her understanding and acceptance.     Plan Discussed with:   Anesthesia Plan Comments:         Anesthesia Quick Evaluation

## 2015-09-29 NOTE — Anesthesia Postprocedure Evaluation (Signed)
Anesthesia Post Note  Patient: Ronald FredericksonRobert W Bruce  Procedure(s) Performed: Procedure(s) (LRB): CATARACT EXTRACTION PHACO AND INTRAOCULAR LENS PLACEMENT (IOC) (Left)  Patient location during evaluation: Short Stay Anesthesia Type: MAC Level of consciousness: awake and alert and oriented Pain management: pain level controlled Vital Signs Assessment: post-procedure vital signs reviewed and stable Respiratory status: spontaneous breathing Cardiovascular status: stable Postop Assessment: no signs of nausea or vomiting Anesthetic complications: no    Last Vitals:  Filed Vitals:   09/29/15 0804 09/29/15 0840  BP: 168/74 162/76  Temp: 36.8 C   Resp: 20 19    Last Pain: There were no vitals filed for this visit.               Ronald Bruce A

## 2015-09-29 NOTE — H&P (Signed)
The patient was re examined and there is no change in the patients condition since the original H and P. 

## 2015-09-29 NOTE — Op Note (Signed)
Patient brought to the operating room and prepped and draped in the usual manner.  Lid speculum inserted in left eye.  Stab incision made at the twelve o'clock position.  Provisc instilled in the anterior chamber.   A 2.4 mm. Stab incision was made temporally.  An anterior capsulotomy was done with a bent 25 gauge needle.  The nucleus was hydrodissected.  The Phaco tip was inserted in the anterior chamber and the nucleus was emulsified.  CDE was 7.12.  The cortical material was then removed with the I and A tip.  Posterior capsule was the polished.  The anterior chamber was deepened with Provisc.  A 19.5 Diopter Hoya Model 250 IOL was then inserted in the capsular bag.  Provisc was then removed with the I and A tip.  The wound was then hydrated.  Patient sent to the Recovery Room in good condition with follow up in my office.  Preoperative Diagnosis: Cortical and Nuclear Cataract OS Postoperative Diagnosis:  Same Procedure name: Kelman Phacoemulsification OS with IOL

## 2015-09-30 ENCOUNTER — Encounter (HOSPITAL_COMMUNITY): Payer: Self-pay | Admitting: Ophthalmology

## 2016-02-16 DIAGNOSIS — I10 Essential (primary) hypertension: Secondary | ICD-10-CM | POA: Diagnosis not present

## 2016-02-16 DIAGNOSIS — R7301 Impaired fasting glucose: Secondary | ICD-10-CM | POA: Diagnosis not present

## 2016-02-18 DIAGNOSIS — I10 Essential (primary) hypertension: Secondary | ICD-10-CM | POA: Diagnosis not present

## 2016-02-18 DIAGNOSIS — F339 Major depressive disorder, recurrent, unspecified: Secondary | ICD-10-CM | POA: Diagnosis not present

## 2016-02-18 DIAGNOSIS — R600 Localized edema: Secondary | ICD-10-CM | POA: Diagnosis not present

## 2016-02-18 DIAGNOSIS — R531 Weakness: Secondary | ICD-10-CM | POA: Diagnosis not present

## 2016-02-18 DIAGNOSIS — R7301 Impaired fasting glucose: Secondary | ICD-10-CM | POA: Diagnosis not present

## 2016-02-18 DIAGNOSIS — N400 Enlarged prostate without lower urinary tract symptoms: Secondary | ICD-10-CM | POA: Diagnosis not present

## 2016-02-18 DIAGNOSIS — Z6826 Body mass index (BMI) 26.0-26.9, adult: Secondary | ICD-10-CM | POA: Diagnosis not present

## 2016-05-11 ENCOUNTER — Emergency Department (HOSPITAL_COMMUNITY): Payer: Medicare HMO

## 2016-05-11 ENCOUNTER — Encounter (HOSPITAL_COMMUNITY): Payer: Self-pay | Admitting: Emergency Medicine

## 2016-05-11 ENCOUNTER — Inpatient Hospital Stay (HOSPITAL_COMMUNITY)
Admission: EM | Admit: 2016-05-11 | Discharge: 2016-05-22 | DRG: 064 | Disposition: A | Discharge status: Home Health | Payer: Medicare HMO | Attending: Internal Medicine | Admitting: Internal Medicine

## 2016-05-11 DIAGNOSIS — R739 Hyperglycemia, unspecified: Secondary | ICD-10-CM | POA: Diagnosis not present

## 2016-05-11 DIAGNOSIS — Z515 Encounter for palliative care: Secondary | ICD-10-CM

## 2016-05-11 DIAGNOSIS — I69354 Hemiplegia and hemiparesis following cerebral infarction affecting left non-dominant side: Secondary | ICD-10-CM

## 2016-05-11 DIAGNOSIS — I69920 Aphasia following unspecified cerebrovascular disease: Secondary | ICD-10-CM | POA: Diagnosis not present

## 2016-05-11 DIAGNOSIS — E538 Deficiency of other specified B group vitamins: Secondary | ICD-10-CM | POA: Diagnosis present

## 2016-05-11 DIAGNOSIS — Z79899 Other long term (current) drug therapy: Secondary | ICD-10-CM

## 2016-05-11 DIAGNOSIS — R269 Unspecified abnormalities of gait and mobility: Secondary | ICD-10-CM | POA: Diagnosis not present

## 2016-05-11 DIAGNOSIS — I214 Non-ST elevation (NSTEMI) myocardial infarction: Secondary | ICD-10-CM | POA: Diagnosis present

## 2016-05-11 DIAGNOSIS — I63233 Cerebral infarction due to unspecified occlusion or stenosis of bilateral carotid arteries: Secondary | ICD-10-CM | POA: Diagnosis not present

## 2016-05-11 DIAGNOSIS — R7989 Other specified abnormal findings of blood chemistry: Secondary | ICD-10-CM | POA: Diagnosis present

## 2016-05-11 DIAGNOSIS — T380X5A Adverse effect of glucocorticoids and synthetic analogues, initial encounter: Secondary | ICD-10-CM | POA: Diagnosis not present

## 2016-05-11 DIAGNOSIS — Z6820 Body mass index (BMI) 20.0-20.9, adult: Secondary | ICD-10-CM | POA: Diagnosis not present

## 2016-05-11 DIAGNOSIS — I619 Nontraumatic intracerebral hemorrhage, unspecified: Secondary | ICD-10-CM | POA: Diagnosis not present

## 2016-05-11 DIAGNOSIS — R296 Repeated falls: Secondary | ICD-10-CM | POA: Diagnosis present

## 2016-05-11 DIAGNOSIS — E43 Unspecified severe protein-calorie malnutrition: Secondary | ICD-10-CM | POA: Diagnosis not present

## 2016-05-11 DIAGNOSIS — N390 Urinary tract infection, site not specified: Secondary | ICD-10-CM | POA: Diagnosis not present

## 2016-05-11 DIAGNOSIS — I672 Cerebral atherosclerosis: Secondary | ICD-10-CM | POA: Diagnosis present

## 2016-05-11 DIAGNOSIS — R4701 Aphasia: Secondary | ICD-10-CM | POA: Diagnosis not present

## 2016-05-11 DIAGNOSIS — E876 Hypokalemia: Secondary | ICD-10-CM | POA: Diagnosis not present

## 2016-05-11 DIAGNOSIS — Z801 Family history of malignant neoplasm of trachea, bronchus and lung: Secondary | ICD-10-CM

## 2016-05-11 DIAGNOSIS — W19XXXA Unspecified fall, initial encounter: Secondary | ICD-10-CM | POA: Diagnosis present

## 2016-05-11 DIAGNOSIS — I629 Nontraumatic intracranial hemorrhage, unspecified: Secondary | ICD-10-CM | POA: Diagnosis not present

## 2016-05-11 DIAGNOSIS — R451 Restlessness and agitation: Secondary | ICD-10-CM | POA: Diagnosis not present

## 2016-05-11 DIAGNOSIS — R627 Adult failure to thrive: Secondary | ICD-10-CM

## 2016-05-11 DIAGNOSIS — I610 Nontraumatic intracerebral hemorrhage in hemisphere, subcortical: Secondary | ICD-10-CM | POA: Diagnosis not present

## 2016-05-11 DIAGNOSIS — I10 Essential (primary) hypertension: Secondary | ICD-10-CM | POA: Diagnosis present

## 2016-05-11 DIAGNOSIS — Z7902 Long term (current) use of antithrombotics/antiplatelets: Secondary | ICD-10-CM | POA: Diagnosis not present

## 2016-05-11 DIAGNOSIS — R260 Ataxic gait: Secondary | ICD-10-CM | POA: Diagnosis not present

## 2016-05-11 DIAGNOSIS — S2231XA Fracture of one rib, right side, initial encounter for closed fracture: Secondary | ICD-10-CM | POA: Diagnosis not present

## 2016-05-11 DIAGNOSIS — Z23 Encounter for immunization: Secondary | ICD-10-CM

## 2016-05-11 DIAGNOSIS — S2239XA Fracture of one rib, unspecified side, initial encounter for closed fracture: Secondary | ICD-10-CM | POA: Diagnosis not present

## 2016-05-11 DIAGNOSIS — E860 Dehydration: Secondary | ICD-10-CM | POA: Diagnosis not present

## 2016-05-11 DIAGNOSIS — R748 Abnormal levels of other serum enzymes: Secondary | ICD-10-CM | POA: Diagnosis not present

## 2016-05-11 DIAGNOSIS — Z66 Do not resuscitate: Secondary | ICD-10-CM | POA: Diagnosis present

## 2016-05-11 DIAGNOSIS — R262 Difficulty in walking, not elsewhere classified: Secondary | ICD-10-CM

## 2016-05-11 DIAGNOSIS — I6340 Cerebral infarction due to embolism of unspecified cerebral artery: Secondary | ICD-10-CM | POA: Diagnosis not present

## 2016-05-11 DIAGNOSIS — F0150 Vascular dementia without behavioral disturbance: Secondary | ICD-10-CM | POA: Diagnosis present

## 2016-05-11 DIAGNOSIS — I69952 Hemiplegia and hemiparesis following unspecified cerebrovascular disease affecting left dominant side: Secondary | ICD-10-CM | POA: Diagnosis not present

## 2016-05-11 DIAGNOSIS — Z7401 Bed confinement status: Secondary | ICD-10-CM | POA: Diagnosis not present

## 2016-05-11 DIAGNOSIS — I679 Cerebrovascular disease, unspecified: Secondary | ICD-10-CM | POA: Diagnosis not present

## 2016-05-11 DIAGNOSIS — I4891 Unspecified atrial fibrillation: Secondary | ICD-10-CM | POA: Diagnosis present

## 2016-05-11 DIAGNOSIS — Z7189 Other specified counseling: Secondary | ICD-10-CM | POA: Diagnosis not present

## 2016-05-11 DIAGNOSIS — I639 Cerebral infarction, unspecified: Secondary | ICD-10-CM | POA: Diagnosis not present

## 2016-05-11 DIAGNOSIS — Z8249 Family history of ischemic heart disease and other diseases of the circulatory system: Secondary | ICD-10-CM

## 2016-05-11 DIAGNOSIS — R410 Disorientation, unspecified: Secondary | ICD-10-CM | POA: Diagnosis not present

## 2016-05-11 DIAGNOSIS — D696 Thrombocytopenia, unspecified: Secondary | ICD-10-CM | POA: Diagnosis present

## 2016-05-11 DIAGNOSIS — R778 Other specified abnormalities of plasma proteins: Secondary | ICD-10-CM

## 2016-05-11 DIAGNOSIS — R4182 Altered mental status, unspecified: Secondary | ICD-10-CM | POA: Diagnosis present

## 2016-05-11 DIAGNOSIS — R279 Unspecified lack of coordination: Secondary | ICD-10-CM | POA: Diagnosis not present

## 2016-05-11 HISTORY — DX: Cerebral infarction, unspecified: I63.9

## 2016-05-11 LAB — RAPID URINE DRUG SCREEN, HOSP PERFORMED
AMPHETAMINES: NOT DETECTED
Barbiturates: NOT DETECTED
Benzodiazepines: NOT DETECTED
Cocaine: NOT DETECTED
OPIATES: NOT DETECTED
Tetrahydrocannabinol: NOT DETECTED

## 2016-05-11 LAB — COMPREHENSIVE METABOLIC PANEL
ALT: 8 U/L — ABNORMAL LOW (ref 17–63)
ANION GAP: 9 (ref 5–15)
AST: 22 U/L (ref 15–41)
Albumin: 3.6 g/dL (ref 3.5–5.0)
Alkaline Phosphatase: 367 U/L — ABNORMAL HIGH (ref 38–126)
BILIRUBIN TOTAL: 0.7 mg/dL (ref 0.3–1.2)
BUN: 26 mg/dL — AB (ref 6–20)
CHLORIDE: 107 mmol/L (ref 101–111)
CO2: 26 mmol/L (ref 22–32)
Calcium: 9.2 mg/dL (ref 8.9–10.3)
Creatinine, Ser: 1.06 mg/dL (ref 0.61–1.24)
Glucose, Bld: 167 mg/dL — ABNORMAL HIGH (ref 65–99)
POTASSIUM: 3 mmol/L — AB (ref 3.5–5.1)
Sodium: 142 mmol/L (ref 135–145)
TOTAL PROTEIN: 7 g/dL (ref 6.5–8.1)

## 2016-05-11 LAB — CBC
HEMATOCRIT: 46.6 % (ref 39.0–52.0)
Hemoglobin: 15.4 g/dL (ref 13.0–17.0)
MCH: 30.6 pg (ref 26.0–34.0)
MCHC: 33 g/dL (ref 30.0–36.0)
MCV: 92.6 fL (ref 78.0–100.0)
PLATELETS: 104 10*3/uL — AB (ref 150–400)
RBC: 5.03 MIL/uL (ref 4.22–5.81)
RDW: 12.9 % (ref 11.5–15.5)
WBC: 7.2 10*3/uL (ref 4.0–10.5)

## 2016-05-11 LAB — CK: Total CK: 146 U/L (ref 49–397)

## 2016-05-11 LAB — URINALYSIS, ROUTINE W REFLEX MICROSCOPIC
BILIRUBIN URINE: NEGATIVE
Bacteria, UA: NONE SEEN
Glucose, UA: NEGATIVE mg/dL
KETONES UR: NEGATIVE mg/dL
LEUKOCYTES UA: NEGATIVE
Nitrite: NEGATIVE
PH: 5 (ref 5.0–8.0)
PROTEIN: 100 mg/dL — AB
Specific Gravity, Urine: 1.019 (ref 1.005–1.030)

## 2016-05-11 LAB — TROPONIN I: Troponin I: 1.32 ng/mL (ref <0.03)

## 2016-05-11 LAB — ETHANOL

## 2016-05-11 LAB — CBG MONITORING, ED: Glucose-Capillary: 156 mg/dL — ABNORMAL HIGH (ref 65–99)

## 2016-05-11 LAB — AMMONIA: AMMONIA: 23 umol/L (ref 9–35)

## 2016-05-11 MED ORDER — AMLODIPINE BESYLATE 5 MG PO TABS
10.0000 mg | ORAL_TABLET | Freq: Every day | ORAL | Status: DC
  Administered 2016-05-12 – 2016-05-14 (×2): 10 mg via ORAL
  Administered 2016-05-15: 5 mg via ORAL
  Administered 2016-05-17 – 2016-05-22 (×5): 10 mg via ORAL
  Filled 2016-05-11 (×10): qty 2

## 2016-05-11 MED ORDER — LORAZEPAM 2 MG/ML IJ SOLN
1.0000 mg | Freq: Once | INTRAMUSCULAR | Status: AC
  Administered 2016-05-11: 1 mg via INTRAVENOUS
  Filled 2016-05-11: qty 1

## 2016-05-11 MED ORDER — NEBIVOLOL HCL 10 MG PO TABS
10.0000 mg | ORAL_TABLET | Freq: Every day | ORAL | Status: DC
  Administered 2016-05-12 – 2016-05-21 (×7): 10 mg via ORAL
  Filled 2016-05-11 (×9): qty 1

## 2016-05-11 MED ORDER — SODIUM CHLORIDE 0.9 % IV BOLUS (SEPSIS)
500.0000 mL | Freq: Once | INTRAVENOUS | Status: AC
  Administered 2016-05-11: 500 mL via INTRAVENOUS

## 2016-05-11 MED ORDER — ATORVASTATIN CALCIUM 40 MG PO TABS
40.0000 mg | ORAL_TABLET | Freq: Every day | ORAL | Status: DC
  Administered 2016-05-12 – 2016-05-21 (×9): 40 mg via ORAL
  Filled 2016-05-11 (×10): qty 1

## 2016-05-11 MED ORDER — ALPRAZOLAM 1 MG PO TABS
1.0000 mg | ORAL_TABLET | Freq: Three times a day (TID) | ORAL | Status: DC | PRN
  Administered 2016-05-14 – 2016-05-15 (×3): 1 mg via ORAL
  Filled 2016-05-11 (×4): qty 1

## 2016-05-11 MED ORDER — AMLODIPINE BESYLATE-VALSARTAN 10-320 MG PO TABS: 1.0000 | ORAL_TABLET | Freq: Every day | ORAL | Status: DC

## 2016-05-11 MED ORDER — SODIUM CHLORIDE 0.9% FLUSH
3.0000 mL | Freq: Two times a day (BID) | INTRAVENOUS | Status: DC
  Administered 2016-05-12 – 2016-05-22 (×12): 3 mL via INTRAVENOUS

## 2016-05-11 MED ORDER — IRBESARTAN 300 MG PO TABS
300.0000 mg | ORAL_TABLET | Freq: Every day | ORAL | Status: DC
  Administered 2016-05-12 – 2016-05-22 (×8): 300 mg via ORAL
  Filled 2016-05-11 (×10): qty 1

## 2016-05-11 MED ORDER — DEXTROSE-NACL 5-0.9 % IV SOLN: INTRAVENOUS | Status: DC

## 2016-05-11 MED ORDER — PNEUMOCOCCAL VAC POLYVALENT 25 MCG/0.5ML IJ INJ
0.5000 mL | INJECTION | INTRAMUSCULAR | Status: AC
  Administered 2016-05-12: 0.5 mL via INTRAMUSCULAR
  Filled 2016-05-11: qty 0.5

## 2016-05-11 MED ORDER — TAMSULOSIN HCL 0.4 MG PO CAPS
0.4000 mg | ORAL_CAPSULE | Freq: Every day | ORAL | Status: DC
  Administered 2016-05-12 – 2016-05-22 (×8): 0.4 mg via ORAL
  Filled 2016-05-11 (×10): qty 1

## 2016-05-11 MED ORDER — ESCITALOPRAM OXALATE 10 MG PO TABS
20.0000 mg | ORAL_TABLET | Freq: Every day | ORAL | Status: DC
Start: 2016-05-12 — End: 2016-05-22
  Administered 2016-05-12 – 2016-05-22 (×8): 20 mg via ORAL
  Filled 2016-05-11 (×10): qty 2

## 2016-05-11 NOTE — H&P (Signed)
History and Physical    Laurens Matheny RUE:[id number] DOB: 07-29-1935 DOA: 05/11/2016  PCP: [name], MD  Patient coming from: Home.    Chief Complaint:  Weakness, falling, and not eating.   HPI: Ronald Bruce is an 81 y.o. 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  Work up in the ER included a head CT which showed a small intracebral hemorrhage, appeared not acute, along prior stroke.  He was found to have a troponin of 1.32.  EKG showed T wave inversion on precordial leads, but no acute changes.  He also has a K of 3.0, normal renal fx test, normal NH3, and a clear CXR, with fx rib.  Neurology was called, who felt that his intracranial blood is likely old.  Hospitalist was asked to admit him for further work up of intracranial bleed, elevated troponin, and failure to thrive. He has no fever, chills, nausea, vomiting, HA, and he is conversing normally.   ED Course:  See above.  Rewiew of Systems:  Constitutional: Negative for malaise, fever and chills. No significant weight loss or weight gain Eyes: Negative for eye pain, redness and discharge, diplopia, visual changes, or flashes of light. ENMT: Negative for ear pain, hoarseness, nasal congestion, sinus pressure and sore throat. No headaches; tinnitus, drooling, or problem swallowing. Cardiovascular: Negative for chest pain, palpitations, diaphoresis, dyspnea and peripheral edema. ; No orthopnea, PND Respiratory: Negative for cough, hemoptysis, wheezing and stridor. No pleuritic chestpain. Gastrointestinal: Negative for diarrhea, constipation,  melena, blood in stool, hematemesis, jaundice and rectal bleeding.    Genitourinary: Negative for frequency, dysuria, incontinence,flank pain and hematuria; Musculoskeletal: Negative for back pain and neck pain. Negative for  swelling and trauma.;  Skin: . Negative for pruritus, rash, abrasions, bruising and skin lesion.; ulcerations Neuro: Negative for headache, lightheadedness and neck stiffness. Negative for weakness, altered level of consciousness , altered mental status, extremity weakness, burning feet, involuntary movement, seizure and syncope.  Psych: negative for anxiety, depression, insomnia, tearfulness, panic attacks, hallucinations, paranoia, suicidal or homicidal ideation    Past Medical History:  Diagnosis Date  . Stroke [facility])     History reviewed. No pertinent surgical history.   reports that he has never smoked. He has never used smokeless tobacco. He reports that he drinks alcohol. His drug history is not on file.  No Known Allergies  History reviewed. No pertinent family history.   Prior to Admission medications   Medication Sig Start Date End Date Taking? Authorizing Provider  ALPRAZolam Prudy Feeler) 1 MG tablet Take 1 mg by mouth 3 (three) times daily as needed for anxiety.   Yes Historical Provider, MD  amLODipine-valsartan (EXFORGE) 10-320 MG tablet Take 1 tablet by mouth daily.   Yes Historical Provider, MD  cholecalciferol (VITAMIN D) 1000 units tablet Take 1,000 Units by mouth daily.   Yes Historical Provider, MD  clopidogrel (PLAVIX) 75 MG tablet Take 75 mg by mouth daily. 05/01/16  Yes Historical Provider, MD  escitalopram (LEXAPRO) 20 MG tablet Take 20 mg by mouth daily.   Yes Historical Provider, MD  nebivolol (BYSTOLIC) 10 MG tablet Take 10 mg by mouth daily.   Yes Historical Provider, MD  tamsulosin (FLOMAX) 0.4 MG CAPS capsule Take 0.4 mg by mouth daily.   Yes Historical Provider, MD    Physical Exam: Vitals:   05/11/16 2100  05/11/16 2115 05/11/16 2130 05/11/16 2155  BP: 168/83  160/80 160/80  Pulse:  (!) 56  67  Resp: 14 17 14 24   Temp:      TempSrc:      SpO2:  96%  96%  Weight:      Height:          Constitutional: NAD, calm, comfortable Vitals:   05/11/16  2100 05/11/16 2115 05/11/16 2130 05/11/16 2155  BP: 168/83  160/80 160/80  Pulse:  (!) 56  67  Resp: 14 17 14 24   Temp:      TempSrc:      SpO2:  96%  96%  Weight:      Height:       Eyes: PERRL, lids and conjunctivae normal ENMT: Mucous membranes are moist. Posterior pharynx clear of any exudate or lesions.Normal dentition.  Neck: normal, supple, no masses, no thyromegaly Respiratory: clear to auscultation bilaterally, no wheezing, no crackles. Normal respiratory effort. No accessory muscle use.  Cardiovascular: Regular rate and rhythm, no murmurs / rubs / gallops. No extremity edema. 2+ pedal pulses. No carotid bruits.  Abdomen: no tenderness, no masses palpated. No hepatosplenomegaly. Bowel sounds positive.  Musculoskeletal: no clubbing / cyanosis. No joint deformity upper and lower extremities. Good ROM, no contractures. Normal muscle tone.  Skin: no rashes, lesions, ulcers. No induration Neurologic: CN 2-12 grossly intact. Sensation intact, DTR normal. Strength 5/5 on the right, 3.5/5 on the left.  Psychiatric: Normal judgment and insight. Alert and oriented x 3. Normal mood.     Labs on Admission: I have personally reviewed following labs and imaging studies  CBC:  Recent Labs Lab 05/11/16 1704  WBC 7.2  HGB 15.4  HCT 46.6  MCV 92.6  PLT 104*   Basic Metabolic Panel:  Recent Labs Lab 05/11/16 1704  NA 142  K 3.0*  CL 107  CO2 26  GLUCOSE 167*  BUN 26*  CREATININE 1.06  CALCIUM 9.2   GFR: Estimated Creatinine Clearance: 65.3 mL/min (by C-G formula based on SCr of 1.06 mg/dL). Liver Function Tests:  Recent Labs Lab 05/11/16 1704  AST 22  ALT 8*  ALKPHOS 367*  BILITOT 0.7  PROT 7.0  ALBUMIN 3.6    Recent Labs Lab 05/11/16 1927  AMMONIA 23   Cardiac Enzymes:  Recent Labs Lab 05/11/16 1827  CKTOTAL 146  TROPONINI 1.32*   CBG:  Recent Labs Lab 05/11/16 1643  GLUCAP 156*   Urine analysis:    Component Value Date/Time   COLORURINE  YELLOW 05/11/2016 1820   APPEARANCEUR CLEAR 05/11/2016 1820   LABSPEC 1.019 05/11/2016 1820   PHURINE 5.0 05/11/2016 1820   GLUCOSEU NEGATIVE 05/11/2016 1820   HGBUR SMALL (A) 05/11/2016 1820   BILIRUBINUR NEGATIVE 05/11/2016 1820   KETONESUR NEGATIVE 05/11/2016 1820   PROTEINUR 100 (A) 05/11/2016 1820   NITRITE NEGATIVE 05/11/2016 1820   LEUKOCYTESUR NEGATIVE 05/11/2016 1820    Radiological Exams on Admission: Dg Ribs Unilateral W/chest Right  Result Date: 05/11/2016 CLINICAL DATA:  Fall with right lateral rib pain. Initial encounter. EXAM: RIGHT RIBS AND CHEST - 3+ VIEW COMPARISON:  None. FINDINGS: The heart size and mediastinal contours are normal. Lung volumes are low bilaterally. There is no evidence of pulmonary edema, consolidation, pneumothorax, nodule or pleural fluid. There is evidence of a relatively nondisplaced fracture involving the distal aspect of the right eleventh rib. No other injuries identified. No evidence of bony lesions. IMPRESSION: Nondisplaced fracture involving the distal right eleventh rib. Electronically  Signed   By: Irish LackGlenn  Yamagata M.D.   On: 05/11/2016 19:15   Ct Head Wo Contrast  Result Date: 05/11/2016 CLINICAL DATA:  Confusion and falls.  Previous stroke. EXAM: CT HEAD WITHOUT CONTRAST TECHNIQUE: Contiguous axial images were obtained from the base of the skull through the vertex without intravenous contrast. COMPARISON:  None available. FINDINGS: Brain: A 9 mm hyperdense focus is seen in the posterior right frontal lobe near the precentral gyrus, just above the sylvian fissure. No other acute hemorrhage is present. A remote right lentiform nucleus and white matter infarct is present. Asymmetric white matter hypoattenuation is present on the right. There is moderate diffuse atrophy. The remote infarct involves the anterior right insular cortex. Remote lacunar infarcts are present in the cerebellum bilaterally. Calcifications are present along the tentorium.  Vascular: Dense calcifications are present in the cavernous internal carotid arteries and at the dural margin of the vertebral arteries bilaterally. There is no hyperdense vessel. Skull: The calvarium is intact. An unerupted right maxillary molar is present. No focal lytic or blastic lesions are present. No significant extracranial soft tissue injury is present. Sinuses/Orbits: Mild mucosal thickening is present along the floor of the maxillary sinuses bilaterally. The remaining paranasal sinuses and the mastoid air cells are clear. There are no fluid levels. A left lens extraction is noted. The globes and orbits are otherwise within normal limits. IMPRESSION: 1. 9 mm focal cortical/subcortical hemorrhage in the lateral aspect of the posterior right frontal lobe subjacent to the precentral gyrus. This likely reflects a small parenchymal hemorrhage. Underlying lesion is not excluded. 2. Remote infarct involving the right lentiform nucleus, anterior insular ribbon, and and superior white matter. 3. Moderate diffuse atrophy and white matter disease likely reflects the sequela of chronic microvascular ischemia. These results were called by telephone at the time of interpretation on 05/11/2016 at 7:22 pm to Dr. Loren RacerAVID YELVERTON , who verbally acknowledged these results. Electronically Signed   By: Marin Robertshristopher  Mattern M.D.   On: 05/11/2016 19:22    EKG: Independently reviewed.  Assessment/Plan Principal Problem:   Intracranial bleeding (HCC) Active Problems:   Dehydration   Elevated troponin I level    PLAN:   Elevation of troponin:  Possible NSTEMI.  Given his intracranial bleed, will hold ASA and Plavix.  Start him on statin and continue with betablocker.  Obtain ECHO, and consult cardiology.   His elevated troponin could be from his intracranial bleed, and insufficient renal clearance.  Dehydration:  Will give IVF with K supplement.  Fx rib:  Percocet PRN.  He is aware.  Intracranial bleed.  Obtain  MRI/MRA brain and neck.  Consult neurology tomorrow.  Likely no intervention is required.  He would need STR likely.  WIll obtain Speech prior to diet, and request PT/OT eval.   DVT prophylaxis: SCD. Code Status: FULL CODE:  Discussed and confirmed tonight.  Family Communication: Sister and son at bedside.  Disposition Plan: probably STR>  Consults called: None. [facility].  Admission status: Inpatient.    Sebastiana Wuest MD FACP. [facility]  If 7PM-7AM, please contact night-coverage [email or web address] Password [email or web address]  05/11/2016, 10:10 PM

## 2016-05-11 NOTE — ED Provider Notes (Signed)
[facility]-EMERGENCY DEPT Provider Note   CSN: [id number] Arrival date & time: 05/11/16  1618     History   Chief Complaint Chief Complaint  Patient presents with  . Altered Mental Status    HPI Ronald Bruce is a 81 y.o. male.  HPI Patient is brought in by family. Previous history of stroke with residual left-sided weakness. He lives with his daughter. States he's had increasing number of falls recently. Also has had decreased mobility and decreased oral intake of liquids and solids. Patient also been refusing all medication. He's had increased confusion. Patient complains of right-sided rib pain. States he thinks he broke some ribs from a fall. He is unable to give the details of the fall. Unknown whether he struck his head. Past Medical History:  Diagnosis Date  . Hypertension   . Stroke [facility])     Patient Active Problem List   Diagnosis Date Noted  . Stroke (HCC)   . Hypokalemia   . Intracranial bleeding (HCC) 05/11/2016  . Dehydration 05/11/2016  . Elevated troponin 05/11/2016    History reviewed. No pertinent surgical history.     Home Medications    Prior to Admission medications   Medication Sig Start Date End Date Taking? Authorizing Provider  ALPRAZolam Prudy Feeler) 1 MG tablet Take 1 mg by mouth 3 (three) times daily as needed for anxiety.   Yes Historical Provider, MD  amLODipine-valsartan (EXFORGE) 10-320 MG tablet Take 1 tablet by mouth daily.   Yes Historical Provider, MD  cholecalciferol (VITAMIN D) 1000 units tablet Take 1,000 Units by mouth daily.   Yes Historical Provider, MD  clopidogrel (PLAVIX) 75 MG tablet Take 75 mg by mouth daily. 05/01/16  Yes Historical Provider, MD  escitalopram (LEXAPRO) 20 MG tablet Take 20 mg by mouth daily.   Yes Historical Provider, MD  nebivolol (BYSTOLIC) 10 MG tablet Take 10 mg by mouth daily.   Yes Historical Provider, MD  tamsulosin (FLOMAX) 0.4 MG CAPS capsule Take 0.4 mg by mouth daily.   Yes Historical Provider, MD     Family History Family History  Problem Relation Age of Onset  . Hypertension Mother   . Pneumonia Mother   . Lung cancer Father     Social History Social History  Substance Use Topics  . Smoking status: Never Smoker  . Smokeless tobacco: Never Used  . Alcohol use 2.4 - 3.0 oz/week    4 - 5 Cans of beer per week     Comment: Daily     Allergies   Patient has no known allergies.   Review of Systems Review of Systems  Constitutional: Positive for activity change and appetite change. Negative for fever.  Respiratory: Negative for cough and shortness of breath.   Cardiovascular: Positive for chest pain.  Gastrointestinal: Negative for abdominal pain, diarrhea, nausea and vomiting.  Genitourinary: Negative for dysuria, flank pain and hematuria.  Musculoskeletal: Positive for arthralgias and myalgias. Negative for neck pain and neck stiffness.  Skin: Negative for rash and wound.  Neurological: Positive for dizziness, weakness and light-headedness. Negative for numbness and headaches.  Psychiatric/Behavioral: Positive for confusion.  All other systems reviewed and are negative.    Physical Exam Updated Vital Signs BP (!) 155/62 (BP Location: Left Arm)   Pulse 64   Temp 98.4 F (36.9 C) (Axillary)   Resp 20   Ht 6' (1.829 m)   Wt 154 lb 11.2 oz (70.2 kg)   SpO2 97%   BMI 20.98 kg/m   Physical Exam  Constitutional: He appears well-developed and well-nourished.  Patient is dry appearing.  HENT:  Head: Normocephalic and atraumatic.  Mouth/Throat: Oropharynx is clear and moist.  Dry mucous membranes. No obvious head injury.  Eyes: EOM are normal. Pupils are equal, round, and reactive to light.  Neck: Normal range of motion. Neck supple.  No posterior midline cervical tenderness to palpation. No meningismus.  Cardiovascular: Normal rate and regular rhythm.  Exam reveals no gallop and no friction rub.   No murmur heard. Pulmonary/Chest: Effort normal and breath  sounds normal. He exhibits tenderness (right lateral rib tenderness to palpation.).  Abdominal: Soft. Bowel sounds are normal. There is no tenderness. There is no rebound and no guarding.  Musculoskeletal: Normal range of motion. He exhibits no edema or tenderness.  Pelvis is stable. No midline thoracic or lumbar tenderness. Distal pulses intact. No lower extremity swelling, asymmetry or tenderness.  Neurological: He is alert.  3/5 motor and left upper and left lower extremities. 5/5 motor and right upper and right lower extremities. Sensation intact. Patient is oriented to person and place.  Skin: Skin is warm and dry. Capillary refill takes less than 2 seconds. No rash noted. No erythema.  Psychiatric: He has a normal mood and affect. His behavior is normal.  Nursing note and vitals reviewed.    ED Treatments / Results  Labs (all labs ordered are listed, but only abnormal results are displayed) Labs Reviewed  COMPREHENSIVE METABOLIC PANEL - Abnormal; Notable for the following:       Result Value   Potassium 3.0 (*)    Glucose, Bld 167 (*)    BUN 26 (*)    ALT 8 (*)    Alkaline Phosphatase 367 (*)    All other components within normal limits  CBC - Abnormal; Notable for the following:    Platelets 104 (*)    All other components within normal limits  URINALYSIS, ROUTINE W REFLEX MICROSCOPIC - Abnormal; Notable for the following:    Hgb urine dipstick SMALL (*)    Protein, ur 100 (*)    All other components within normal limits  TROPONIN I - Abnormal; Notable for the following:    Troponin I 1.32 (*)    All other components within normal limits  TROPONIN I - Abnormal; Notable for the following:    Troponin I 1.31 (*)    All other components within normal limits  TROPONIN I - Abnormal; Notable for the following:    Troponin I 1.20 (*)    All other components within normal limits  TROPONIN I - Abnormal; Notable for the following:    Troponin I 1.13 (*)    All other components  within normal limits  LIPID PANEL - Abnormal; Notable for the following:    HDL 18 (*)    All other components within normal limits  BASIC METABOLIC PANEL - Abnormal; Notable for the following:    Potassium 2.9 (*)    Glucose, Bld 161 (*)    BUN 21 (*)    Calcium 8.6 (*)    All other components within normal limits  POTASSIUM - Abnormal; Notable for the following:    Potassium 3.2 (*)    All other components within normal limits  VITAMIN B12 - Abnormal; Notable for the following:    Vitamin B-12 119 (*)    All other components within normal limits  BASIC METABOLIC PANEL - Abnormal; Notable for the following:    Potassium 3.3 (*)    Chloride 112 (*)  Glucose, Bld 181 (*)    Calcium 8.4 (*)    All other components within normal limits  TROPONIN I - Abnormal; Notable for the following:    Troponin I 1.44 (*)    All other components within normal limits  CBG MONITORING, ED - Abnormal; Notable for the following:    Glucose-Capillary 156 (*)    All other components within normal limits  ETHANOL  RAPID URINE DRUG SCREEN, HOSP PERFORMED  CK  AMMONIA  TSH  RPR  TSH  MAGNESIUM  HOMOCYSTEINE    EKG  EKG Interpretation  Date/Time:  Wednesday May 11 2016 16:42:49 EST Ventricular Rate:  71 PR Interval:    QRS Duration: 93 QT Interval:  404 QTC Calculation: 439 R Axis:   -59 Text Interpretation:  Sinus rhythm Inferior infarct, old Anterior infarct, old Lateral leads are also involved No old tracing to compare Confirmed by Ethelda Chick  MD, SAM [phone]) on 05/11/2016 5:02:21 PM       Radiology Dg Ribs Unilateral W/chest Right  Result Date: 05/11/2016 CLINICAL DATA:  Fall with right lateral rib pain. Initial encounter. EXAM: RIGHT RIBS AND CHEST - 3+ VIEW COMPARISON:  None. FINDINGS: The heart size and mediastinal contours are normal. Lung volumes are low bilaterally. There is no evidence of pulmonary edema, consolidation, pneumothorax, nodule or pleural fluid. There is  evidence of a relatively nondisplaced fracture involving the distal aspect of the right eleventh rib. No other injuries identified. No evidence of bony lesions. IMPRESSION: Nondisplaced fracture involving the distal right eleventh rib. Electronically Signed   By: Irish Lack M.D.   On: 05/11/2016 19:15   Ct Head Wo Contrast  Result Date: 05/11/2016 CLINICAL DATA:  Confusion and falls.  Previous stroke. EXAM: CT HEAD WITHOUT CONTRAST TECHNIQUE: Contiguous axial images were obtained from the base of the skull through the vertex without intravenous contrast. COMPARISON:  None available. FINDINGS: Brain: A 9 mm hyperdense focus is seen in the posterior right frontal lobe near the precentral gyrus, just above the sylvian fissure. No other acute hemorrhage is present. A remote right lentiform nucleus and white matter infarct is present. Asymmetric white matter hypoattenuation is present on the right. There is moderate diffuse atrophy. The remote infarct involves the anterior right insular cortex. Remote lacunar infarcts are present in the cerebellum bilaterally. Calcifications are present along the tentorium. Vascular: Dense calcifications are present in the cavernous internal carotid arteries and at the dural margin of the vertebral arteries bilaterally. There is no hyperdense vessel. Skull: The calvarium is intact. An unerupted right maxillary molar is present. No focal lytic or blastic lesions are present. No significant extracranial soft tissue injury is present. Sinuses/Orbits: Mild mucosal thickening is present along the floor of the maxillary sinuses bilaterally. The remaining paranasal sinuses and the mastoid air cells are clear. There are no fluid levels. A left lens extraction is noted. The globes and orbits are otherwise within normal limits. IMPRESSION: 1. 9 mm focal cortical/subcortical hemorrhage in the lateral aspect of the posterior right frontal lobe subjacent to the precentral gyrus. This likely  reflects a small parenchymal hemorrhage. Underlying lesion is not excluded. 2. Remote infarct involving the right lentiform nucleus, anterior insular ribbon, and and superior white matter. 3. Moderate diffuse atrophy and white matter disease likely reflects the sequela of chronic microvascular ischemia. These results were called by telephone at the time of interpretation on 05/11/2016 at 7:22 pm to Dr. Loren Racer , who verbally acknowledged these results. Electronically Signed  By: Marin Roberts M.D.   On: 05/11/2016 19:22   Mr Maxine Glenn Head Wo Contrast  Result Date: 05/12/2016 CLINICAL DATA:  Stroke.  Hypertension EXAM: MRA HEAD WITHOUT CONTRAST TECHNIQUE: Angiographic images of the Circle of Willis were obtained using MRA technique without intravenous contrast. COMPARISON:  MRI head 05/12/2016 FINDINGS: Both vertebral arteries patent to the basilar without significant stenosis. PICA patent. Mild irregularity of the basilar without significant stenosis. Superior cerebellar arteries patent bilaterally. Irregularity and moderate stenosis in the mid right posterior cerebral artery. Moderate stenosis distal left posterior cerebral artery. Mild atherosclerotic irregularity and stenosis in the cavernous carotid bilaterally. Single A2 segment. Anterior cerebral arteries patent bilaterally. Middle cerebral artery patent bilaterally. Mild irregularity and stenosis and right M2 segment. Negative for cerebral aneurysm. IMPRESSION: Intracranial atherosclerotic disease mild to moderate in degree. No large vessel occlusion. Electronically Signed   By: Marlan Palau M.D.   On: 05/12/2016 08:29   Mr Brain Wo Contrast  Result Date: 05/12/2016 CLINICAL DATA:  Altered mental status. Confusion. Stroke. Hypertension. EXAM: MRI HEAD WITHOUT CONTRAST TECHNIQUE: Multiplanar, multiecho pulse sequences of the brain and surrounding structures were obtained without intravenous contrast. COMPARISON:  CT head 05/11/2016 FINDINGS:  Brain: Numerous small areas of acute infarct bilaterally. These are present throughout the frontal parietal cortex. The largest area is in the right parietal cortex. Small acute infarct in the pons. Multiple small areas of acute infarct in the cerebellum bilaterally. 6 x 10 mm hemorrhage in the right parietal white matter. This is high density on CT and appears acute and related to acute infarction. Other areas of chronic hemorrhage include the left temporal parietal lobe and right basal ganglia. Negative for mass or edema. Chronic microvascular ischemic change in the white matter, right greater than left Generalized moderate atrophy.  Negative for hydrocephalus. Vascular: Normal arterial flow voids. Skull and upper cervical spine: Negative Sinuses/Orbits: Mild mucosal edema in the paranasal sinuses. Left lens replacement. No orbital mass. Other: None IMPRESSION: Numerous small areas of acute cerebral infarction throughout both cerebral hemispheres and cerebellum bilaterally. Pattern most consistent with cerebral emboli. 1 cm area of acute hemorrhage in the right parietal white matter as identified on CT. Electronically Signed   By: Marlan Palau M.D.   On: 05/12/2016 08:34   US Carotid Bilateral  Result Date: 05/12/2016 CLINICAL DATA:  CVA EXAM: BILATERAL CAROTID DUPLEX ULTRASOUND TECHNIQUE: Wallace Cullens scale imaging, color Doppler and duplex ultrasound were performed of bilateral carotid and vertebral arteries in the neck. COMPARISON:  None. FINDINGS: Criteria: Quantification of carotid stenosis is based on velocity parameters that correlate the residual internal carotid diameter with NASCET-based stenosis levels, using the diameter of the distal internal carotid lumen as the denominator for stenosis measurement. The following velocity measurements were obtained: RIGHT ICA:  42 cm/sec CCA:  64 cm/sec SYSTOLIC ICA/CCA RATIO:  0.7 DIASTOLIC ICA/CCA RATIO:  1.0 ECA:  96 cm/sec LEFT ICA:  38 cm/sec CCA:  79 cm/sec  SYSTOLIC ICA/CCA RATIO:  0.5 DIASTOLIC ICA/CCA RATIO:  1.1 ECA:  50 cm/sec RIGHT CAROTID ARTERY: Mild calcified plaque in the bulb. Low resistance internal carotid Doppler pattern. RIGHT VERTEBRAL ARTERY:  Antegrade. LEFT CAROTID ARTERY: Mild calcified plaque in the bulb. Low resistance internal carotid Doppler pattern. LEFT VERTEBRAL ARTERY:  Antegrade. IMPRESSION: Less than 50% stenosis in the right and left internal carotid arteries. Mild calcified plaque is noted in both bulbs. Electronically Signed   By: Jolaine Click M.D.   On: 05/12/2016 08:40    Procedures Procedures (  including critical care time)  Medications Ordered in ED Medications  ALPRAZolam (XANAX) tablet 1 mg (not administered)  escitalopram (LEXAPRO) tablet 20 mg (20 mg Oral Given 05/12/16 1000)  nebivolol (BYSTOLIC) tablet 10 mg (10 mg Oral Given 05/12/16 0908)  tamsulosin (FLOMAX) capsule 0.4 mg (0.4 mg Oral Given 05/12/16 0908)  sodium chloride flush (NS) 0.9 % injection 3 mL (3 mLs Intravenous Given 05/12/16 2239)  atorvastatin (LIPITOR) tablet 40 mg (40 mg Oral Given 05/12/16 1725)  amLODipine (NORVASC) tablet 10 mg (10 mg Oral Given 05/12/16 0908)    And  irbesartan (AVAPRO) tablet 300 mg (300 mg Oral Given 05/12/16 0908)  dextrose 5 % and 0.9 % NaCl with KCl 20 mEq/L infusion ( Intravenous New Bag/Given 05/13/16 0109)  famotidine (PEPCID) IVPB 20 mg premix (20 mg Intravenous Given 05/12/16 2239)   stroke: mapping our early stages of recovery book (not administered)  potassium chloride 10 mEq in 100 mL IVPB (10 mEq Intravenous Given 05/13/16 0844)  sodium chloride 0.9 % bolus 500 mL (0 mLs Intravenous Stopped 05/11/16 1921)  LORazepam (ATIVAN) injection 1 mg (1 mg Intravenous Given 05/11/16 2150)  pneumococcal 23 valent vaccine (PNU-IMMUNE) injection 0.5 mL (0.5 mLs Intramuscular Given 05/12/16 0909)  potassium chloride 10 mEq in 100 mL IVPB (10 mEq Intravenous Given 05/12/16 1858)  LORazepam (ATIVAN) injection 1 mg (1 mg Intravenous Given 05/13/16  0555)  cyanocobalamin ((VITAMIN B-12)) injection 1,000 mcg (1,000 mcg Intramuscular Given 05/13/16 0854)     Initial Impression / Assessment and Plan / ED Course  I have reviewed the triage vital signs and the nursing notes.  Pertinent labs & imaging results that were available during my care of the patient were reviewed by me and considered in my medical decision making (see chart for details).  CRITICAL CARE Performed by: Ranae Palms, Adesuwa Osgood Total critical care time: 30 minutes Critical care time was exclusive of separately billable procedures and treating other patients. Critical care was necessary to treat or prevent imminent or life-threatening deterioration. Critical care was time spent personally by me on the following activities: development of treatment plan with patient and/or surrogate as well as nursing, discussions with consultants, evaluation of patient's response to treatment, examination of patient, obtaining history from patient or surrogate, ordering and performing treatments and interventions, ordering and review of laboratory studies, ordering and review of radiographic studies, pulse oximetry and re-evaluation of patient's condition. Clinical Course    Patient brought in by family for failure to thrive. Increased falls and decreased by mouth intake. Patient appears dehydrated. We'll give IV fluids while workup in the emergency department. Anticipate admission. Patient with questionable intracranial bleed on CT head. Discussed with neurology who think this may be chronic. Patient also has elevation in his troponin. Discussed with hospitalist and we'll see patient and admit. Final Clinical Impressions(s) / ED Diagnoses   Final diagnoses:  Elevated troponin  Dehydration  FTT (failure to thrive) in adult  Stroke (cerebrum) [facility])    New Prescriptions Current Discharge Medication List       Loren Racer, MD 05/13/16 [phone]

## 2016-05-11 NOTE — ED Notes (Signed)
CBG 156 

## 2016-05-11 NOTE — ED Triage Notes (Signed)
Pt started to have confusion, unable to stand and take medications. Stroke in past. Pt oriented to place and person but not time.  Girlfriend states that pt sprayed pesticide on chair in house. No pain stated.

## 2016-05-11 NOTE — ED Notes (Signed)
Pt and family advised that we would need a urine specimen when pt was able.

## 2016-05-11 NOTE — ED Notes (Signed)
Password for pt is-[email or web address]

## 2016-05-12 ENCOUNTER — Inpatient Hospital Stay (HOSPITAL_COMMUNITY): Payer: Medicare HMO

## 2016-05-12 DIAGNOSIS — I214 Non-ST elevation (NSTEMI) myocardial infarction: Secondary | ICD-10-CM

## 2016-05-12 DIAGNOSIS — I619 Nontraumatic intracerebral hemorrhage, unspecified: Secondary | ICD-10-CM | POA: Diagnosis not present

## 2016-05-12 DIAGNOSIS — I639 Cerebral infarction, unspecified: Secondary | ICD-10-CM

## 2016-05-12 DIAGNOSIS — R260 Ataxic gait: Secondary | ICD-10-CM | POA: Diagnosis not present

## 2016-05-12 DIAGNOSIS — I6340 Cerebral infarction due to embolism of unspecified cerebral artery: Principal | ICD-10-CM

## 2016-05-12 DIAGNOSIS — I69952 Hemiplegia and hemiparesis following unspecified cerebrovascular disease affecting left dominant side: Secondary | ICD-10-CM | POA: Diagnosis not present

## 2016-05-12 DIAGNOSIS — E876 Hypokalemia: Secondary | ICD-10-CM

## 2016-05-12 DIAGNOSIS — R269 Unspecified abnormalities of gait and mobility: Secondary | ICD-10-CM | POA: Diagnosis not present

## 2016-05-12 LAB — LIPID PANEL
CHOLESTEROL: 120 mg/dL (ref 0–200)
HDL: 18 mg/dL — AB (ref >40)
LDL Cholesterol: 73 mg/dL (ref 0–99)
TRIGLYCERIDES: 145 mg/dL (ref <150)
Total CHOL/HDL Ratio: 6.7 RATIO
VLDL: 29 mg/dL (ref 0–40)

## 2016-05-12 LAB — BASIC METABOLIC PANEL
ANION GAP: 7 (ref 5–15)
BUN: 21 mg/dL — ABNORMAL HIGH (ref 6–20)
CHLORIDE: 109 mmol/L (ref 101–111)
CO2: 26 mmol/L (ref 22–32)
Calcium: 8.6 mg/dL — ABNORMAL LOW (ref 8.9–10.3)
Creatinine, Ser: 0.77 mg/dL (ref 0.61–1.24)
GFR calc Af Amer: >60 mL/min (ref >60)
Glucose, Bld: 161 mg/dL — ABNORMAL HIGH (ref 65–99)
Potassium: 2.9 mmol/L — ABNORMAL LOW (ref 3.5–5.1)
SODIUM: 142 mmol/L (ref 135–145)

## 2016-05-12 LAB — TSH
TSH: 2.125 u[IU]/mL (ref 0.350–4.500)
TSH: 1.51 u[IU]/mL (ref 0.350–4.500)

## 2016-05-12 LAB — TROPONIN I
TROPONIN I: 1.2 ng/mL — AB (ref <0.03)
TROPONIN I: 1.13 ng/mL — AB (ref <0.03)
Troponin I: 1.31 ng/mL (ref <0.03)

## 2016-05-12 LAB — VITAMIN B12: VITAMIN B 12: 119 pg/mL — AB (ref 180–914)

## 2016-05-12 LAB — POTASSIUM: POTASSIUM: 3.2 mmol/L — AB (ref 3.5–5.1)

## 2016-05-12 MED ORDER — STROKE: EARLY STAGES OF RECOVERY BOOK
Freq: Once | Status: AC
  Administered 2016-05-20: 12:00:00
  Filled 2016-05-12: qty 1

## 2016-05-12 MED ORDER — KCL IN DEXTROSE-NACL 20-5-0.9 MEQ/L-%-% IV SOLN
INTRAVENOUS | Status: DC
  Administered 2016-05-12 – 2016-05-16 (×8): via INTRAVENOUS

## 2016-05-12 MED ORDER — SODIUM CHLORIDE 0.9 % IV SOLN
30.0000 meq | Freq: Once | INTRAVENOUS | Status: DC
  Filled 2016-05-12: qty 15

## 2016-05-12 MED ORDER — POTASSIUM CHLORIDE 10 MEQ/100ML IV SOLN
10.0000 meq | INTRAVENOUS | Status: AC
  Administered 2016-05-12 (×3): 10 meq via INTRAVENOUS
  Filled 2016-05-12 (×2): qty 100

## 2016-05-12 MED ORDER — FAMOTIDINE IN NACL 20-0.9 MG/50ML-% IV SOLN
20.0000 mg | Freq: Two times a day (BID) | INTRAVENOUS | Status: DC
  Administered 2016-05-12 – 2016-05-15 (×8): 20 mg via INTRAVENOUS
  Filled 2016-05-12 (×8): qty 50

## 2016-05-12 NOTE — Progress Notes (Signed)
PROGRESS NOTE    Ronald FolkWayne Bruce  LKG:[id number] DOB: 03/06/1936 DOA: 05/11/2016 PCP: Dwana MelenaZack Hall, MD   Brief Narrative: 81 year old male with history of prior stroke with residual left-sided hemiparesis, presented with generalized weakness and frequent falls not eating much. In the ER CT scan of head showed a small intracranial hemorrhage. MRI of the brain showed numerous small areas of acute cerebral infarction throughout both cerebral hemispheres and cerebellum bilateral. Also with positive troponin. Neurologist and cardiologist consult requested.  Assessment & Plan:   Principal Problem:   Intracranial bleeding (HCC) Active Problems:   Dehydration   Elevated troponin I level  #Acute cerebral infarction, multiple small areas of ischemia in bilateral cerebral hemispheres and cerebellum likely embolic etiology. Also has acute hemorrhagic stroke at right parietal white matter: -Discussed with Dr. Gerilyn Pilgrimoonquah from neurologist. Recommended to hold anticoagulation, echocardiogram. -MRI MRA : Multiple strokes and intracranial atherosclerotic disease. No large vessel occlusion noticed -Carotid ultrasound result reviewed. No significant stenosis. -PT, OT and social worker evaluation. Patient possibly needs skilled nursing facility and rehabilitation on discharge. -Currently on Lipitor 40 mg. Serum LDL level is 73 -Unable to give aspirin or anticoagulation because of intracranial hemorrhage. -Monitor closely. -Currently nothing by mouth pending a speech and swallow evaluation. Continue dextrose IV fluid. On Pepcid IV twice a day.  #Elevated troponin level: Peak troponin level of 1.3. Unknown if it is cardiac ischemic disease versus related with intracranial bleed. Follow-up echocardiogram. Cartilage consult requested. Continue telemetry. Repeat troponin in the morning to make sure it is trending down. -EKG with T-wave changes.  #Hypertension: Currently not receiving medication because of nothing by mouth  status. He takes Norvasc, irbesartan and Bystolic at home. Monitor blood pressure and heart rate. Avoid hypotension or sudden decrease in blood pressure.  #Hypokalemia: Replete potassium IV and oral. Monitor labs.  DVT prophylaxis: SCD and early ambulation. Code Status: Full code Family Communication: Patient's son at bedside Disposition Plan: Likely discharge to SNF in 1-2 days  Consultants:   Neurologist and cardiologist  Procedures: MRI and MRA brain, carotid ultrasound. Echo pending. Antimicrobials: None  Subjective: Patient was seen and examined at bedside. Denied fever, chills, headache, dizziness, lightheadedness, nausea, vomiting, chest pain or shortness of breath. Patient's son at bedside.   Objective: Vitals:   05/11/16 2230 05/11/16 2332 05/12/16 0627 05/12/16 1306  BP: 158/77 (!) 159/69 (!) 144/64 (!) 160/65  Pulse: 78 69 75 66  Resp: 23 20 20 18   Temp:  98.1 F (36.7 C) 98 F (36.7 C) 97.8 F (36.6 C)  TempSrc:  Oral Oral Oral  SpO2: 96% 99% 96% 98%  Weight:  70.2 kg (154 lb 11.2 oz)    Height:  6' (1.829 m)      Intake/Output Summary (Last 24 hours) at 05/12/16 1550 Last data filed at 05/12/16 1500  Gross per 24 hour  Intake          1646.25 ml  Output                0 ml  Net          1646.25 ml   Filed Weights   05/11/16 1624 05/11/16 2332  Weight: 89.8 kg (198 lb) 70.2 kg (154 lb 11.2 oz)    Examination:  General exam: Appears calm and comfortable  Respiratory system: Clear to auscultation. Respiratory effort normal. No wheezing or crackle Cardiovascular system: S1 & S2 heard, RRR.  No pedal edema. Gastrointestinal system: Abdomen is nondistended, soft and nontender. Normal bowel sounds  heard. Central nervous system: Alert and oriented. No focal neurological deficits. Extremities: Good strength in all extremities except slight weakness on the left side. Skin: No rashes, lesions or ulcers Psychiatry: Judgement and insight appear normal. Mood &  affect appropriate.     Data Reviewed: I have personally reviewed following labs and imaging studies  CBC:  Recent Labs Lab 05/11/16 1704  WBC 7.2  HGB 15.4  HCT 46.6  MCV 92.6  PLT 104*   Basic Metabolic Panel:  Recent Labs Lab 05/11/16 1704 05/12/16 0544  NA 142 142  K 3.0* 2.9*  CL 107 109  CO2 26 26  GLUCOSE 167* 161*  BUN 26* 21*  CREATININE 1.06 0.77  CALCIUM 9.2 8.6*   GFR: Estimated Creatinine Clearance: 75.6 mL/min (by C-G formula based on SCr of 0.77 mg/dL). Liver Function Tests:  Recent Labs Lab 05/11/16 1704  AST 22  ALT 8*  ALKPHOS 367*  BILITOT 0.7  PROT 7.0  ALBUMIN 3.6   No results for input(s): LIPASE, AMYLASE in the last 168 hours.  Recent Labs Lab 05/11/16 1927  AMMONIA 23   Coagulation Profile: No results for input(s): INR, PROTIME in the last 168 hours. Cardiac Enzymes:  Recent Labs Lab 05/11/16 1827 05/11/16 2349 05/12/16 0544 05/12/16 1120  CKTOTAL 146  --   --   --   TROPONINI 1.32* 1.31* 1.20* 1.13*   BNP (last 3 results) No results for input(s): PROBNP in the last 8760 hours. HbA1C: No results for input(s): HGBA1C in the last 72 hours. CBG:  Recent Labs Lab 05/11/16 1643  GLUCAP 156*   Lipid Profile:  Recent Labs  05/12/16 0541  CHOL 120  HDL 18*  LDLCALC 73  TRIG 161  CHOLHDL 6.7   Thyroid Function Tests:  Recent Labs  05/11/16 1827  TSH 2.125   Anemia Panel: No results for input(s): VITAMINB12, FOLATE, FERRITIN, TIBC, IRON, RETICCTPCT in the last 72 hours. Sepsis Labs: No results for input(s): PROCALCITON, LATICACIDVEN in the last 168 hours.  No results found for this or any previous visit (from the past 240 hour(s)).       Radiology Studies: Dg Ribs Unilateral W/chest Right  Result Date: 05/11/2016 CLINICAL DATA:  Fall with right lateral rib pain. Initial encounter. EXAM: RIGHT RIBS AND CHEST - 3+ VIEW COMPARISON:  None. FINDINGS: The heart size and mediastinal contours are normal.  Lung volumes are low bilaterally. There is no evidence of pulmonary edema, consolidation, pneumothorax, nodule or pleural fluid. There is evidence of a relatively nondisplaced fracture involving the distal aspect of the right eleventh rib. No other injuries identified. No evidence of bony lesions. IMPRESSION: Nondisplaced fracture involving the distal right eleventh rib. Electronically Signed   By: Irish Lack M.D.   On: 05/11/2016 19:15   Ct Head Wo Contrast  Result Date: 05/11/2016 CLINICAL DATA:  Confusion and falls.  Previous stroke. EXAM: CT HEAD WITHOUT CONTRAST TECHNIQUE: Contiguous axial images were obtained from the base of the skull through the vertex without intravenous contrast. COMPARISON:  None available. FINDINGS: Brain: A 9 mm hyperdense focus is seen in the posterior right frontal lobe near the precentral gyrus, just above the sylvian fissure. No other acute hemorrhage is present. A remote right lentiform nucleus and white matter infarct is present. Asymmetric white matter hypoattenuation is present on the right. There is moderate diffuse atrophy. The remote infarct involves the anterior right insular cortex. Remote lacunar infarcts are present in the cerebellum bilaterally. Calcifications are present along the  tentorium. Vascular: Dense calcifications are present in the cavernous internal carotid arteries and at the dural margin of the vertebral arteries bilaterally. There is no hyperdense vessel. Skull: The calvarium is intact. An unerupted right maxillary molar is present. No focal lytic or blastic lesions are present. No significant extracranial soft tissue injury is present. Sinuses/Orbits: Mild mucosal thickening is present along the floor of the maxillary sinuses bilaterally. The remaining paranasal sinuses and the mastoid air cells are clear. There are no fluid levels. A left lens extraction is noted. The globes and orbits are otherwise within normal limits. IMPRESSION: 1. 9 mm focal  cortical/subcortical hemorrhage in the lateral aspect of the posterior right frontal lobe subjacent to the precentral gyrus. This likely reflects a small parenchymal hemorrhage. Underlying lesion is not excluded. 2. Remote infarct involving the right lentiform nucleus, anterior insular ribbon, and and superior white matter. 3. Moderate diffuse atrophy and white matter disease likely reflects the sequela of chronic microvascular ischemia. These results were called by telephone at the time of interpretation on 05/11/2016 at 7:22 pm to Dr. Loren Racer , who verbally acknowledged these results. Electronically Signed   By: Marin Roberts M.D.   On: 05/11/2016 19:22   Mr Maxine Glenn Head Wo Contrast  Result Date: 05/12/2016 CLINICAL DATA:  Stroke.  Hypertension EXAM: MRA HEAD WITHOUT CONTRAST TECHNIQUE: Angiographic images of the Circle of Willis were obtained using MRA technique without intravenous contrast. COMPARISON:  MRI head 05/12/2016 FINDINGS: Both vertebral arteries patent to the basilar without significant stenosis. PICA patent. Mild irregularity of the basilar without significant stenosis. Superior cerebellar arteries patent bilaterally. Irregularity and moderate stenosis in the mid right posterior cerebral artery. Moderate stenosis distal left posterior cerebral artery. Mild atherosclerotic irregularity and stenosis in the cavernous carotid bilaterally. Single A2 segment. Anterior cerebral arteries patent bilaterally. Middle cerebral artery patent bilaterally. Mild irregularity and stenosis and right M2 segment. Negative for cerebral aneurysm. IMPRESSION: Intracranial atherosclerotic disease mild to moderate in degree. No large vessel occlusion. Electronically Signed   By: Marlan Palau M.D.   On: 05/12/2016 08:29   Mr Brain Wo Contrast  Result Date: 05/12/2016 CLINICAL DATA:  Altered mental status. Confusion. Stroke. Hypertension. EXAM: MRI HEAD WITHOUT CONTRAST TECHNIQUE: Multiplanar, multiecho pulse  sequences of the brain and surrounding structures were obtained without intravenous contrast. COMPARISON:  CT head 05/11/2016 FINDINGS: Brain: Numerous small areas of acute infarct bilaterally. These are present throughout the frontal parietal cortex. The largest area is in the right parietal cortex. Small acute infarct in the pons. Multiple small areas of acute infarct in the cerebellum bilaterally. 6 x 10 mm hemorrhage in the right parietal white matter. This is high density on CT and appears acute and related to acute infarction. Other areas of chronic hemorrhage include the left temporal parietal lobe and right basal ganglia. Negative for mass or edema. Chronic microvascular ischemic change in the white matter, right greater than left Generalized moderate atrophy.  Negative for hydrocephalus. Vascular: Normal arterial flow voids. Skull and upper cervical spine: Negative Sinuses/Orbits: Mild mucosal edema in the paranasal sinuses. Left lens replacement. No orbital mass. Other: None IMPRESSION: Numerous small areas of acute cerebral infarction throughout both cerebral hemispheres and cerebellum bilaterally. Pattern most consistent with cerebral emboli. 1 cm area of acute hemorrhage in the right parietal white matter as identified on CT. Electronically Signed   By: Marlan Palau M.D.   On: 05/12/2016 08:34   US Carotid Bilateral  Result Date: 05/12/2016 CLINICAL DATA:  CVA EXAM:  BILATERAL CAROTID DUPLEX ULTRASOUND TECHNIQUE: Wallace Cullens scale imaging, color Doppler and duplex ultrasound were performed of bilateral carotid and vertebral arteries in the neck. COMPARISON:  None. FINDINGS: Criteria: Quantification of carotid stenosis is based on velocity parameters that correlate the residual internal carotid diameter with NASCET-based stenosis levels, using the diameter of the distal internal carotid lumen as the denominator for stenosis measurement. The following velocity measurements were obtained: RIGHT ICA:  42  cm/sec CCA:  64 cm/sec SYSTOLIC ICA/CCA RATIO:  0.7 DIASTOLIC ICA/CCA RATIO:  1.0 ECA:  96 cm/sec LEFT ICA:  38 cm/sec CCA:  79 cm/sec SYSTOLIC ICA/CCA RATIO:  0.5 DIASTOLIC ICA/CCA RATIO:  1.1 ECA:  50 cm/sec RIGHT CAROTID ARTERY: Mild calcified plaque in the bulb. Low resistance internal carotid Doppler pattern. RIGHT VERTEBRAL ARTERY:  Antegrade. LEFT CAROTID ARTERY: Mild calcified plaque in the bulb. Low resistance internal carotid Doppler pattern. LEFT VERTEBRAL ARTERY:  Antegrade. IMPRESSION: Less than 50% stenosis in the right and left internal carotid arteries. Mild calcified plaque is noted in both bulbs. Electronically Signed   By: Jolaine Click M.D.   On: 05/12/2016 08:40        Scheduled Meds: . amLODipine  10 mg Oral Daily   And  . irbesartan  300 mg Oral Daily  . atorvastatin  40 mg Oral q1800  . escitalopram  20 mg Oral Daily  . famotidine (PEPCID) IV  20 mg Intravenous Q12H  . nebivolol  10 mg Oral Daily  . sodium chloride flush  3 mL Intravenous Q12H  . tamsulosin  0.4 mg Oral Daily   Continuous Infusions: . dextrose 5 % and 0.9 % NaCl with KCl 20 mEq/L 75 mL/hr at 05/12/16 1100     LOS: 1 day    Dron Jaynie Collins, MD [facility] Pager [phone]  If 7PM-7AM, please contact night-coverage [email or web address] Password [email or web address] 05/12/2016, 3:50 PM

## 2016-05-12 NOTE — Progress Notes (Signed)
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 The etiology is most likely due to acute MI with embolic phenomena or atrial fibrillation. Valve disease is also an issue. The patient is to be kept off antiplatelet agents and anticoagulation for the next month. He will need a workup for cardioembolic stroke including echocardiography and possible TEE, 30 day event monitor and possible loop recorder.

## 2016-05-12 NOTE — Progress Notes (Signed)
As Physical therapy was working with pt, she noticed drops of blood coming down from what looked like the scrotal area. I assessed around that area and the penis and found no sign of abrasion, etc. Will continue to monitor.

## 2016-05-12 NOTE — Evaluation (Signed)
Physical Therapy Evaluation Patient Details Name: Ronald Bruce MRN: [id number] DOB: Sep 28, 1935 Today's Date: 05/12/2016   History of Present Illness  Ronald Bruce is an 81 y.o. 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  Work up in the ER included a head CT which showed a small intracebral hemorrhage, appeared not acute, along prior stroke.  He was found to have a troponin of 1.32.  EKG showed T wave inversion on precordial leads, but no acute changes.  He also has a K of 3.0, normal renal fx test, normal NH3, and a clear CXR, with fx rib.  Neurology was called, who felt that his intracranial blood is likely old.  Hospitalist was asked to admit him for further work up of intracranial bleed, elevated troponin, and failure to thrive. He has no fever, chills, nausea, vomiting, HA, and he is conversing normally  Clinical Impression  Ronald Bruce is a 81 yo male who normally is able to be modified I in his ADL's including walking with a walker.  He has had a new intracranial bleed and is showing some signs of lt side neglect and has decreased mobility and ambulation ability from prior level of functioning.  He will benefit from skilled physical therapy while in acute stay as well as post discharge to return him to his maximal functioning level.     Follow Up Recommendations SNF    Equipment Recommendations  None recommended by PT    Recommendations for Other Services       Precautions / Restrictions Precautions Precautions: Fall Restrictions Weight Bearing Restrictions: No      Mobility  Bed Mobility Overal bed mobility: Needs Assistance Bed Mobility: Supine to Sit     Supine to sit: Min assist        Transfers Overall transfer level: Needs assistance   Transfers: Sit to/from Stand Sit to Stand: Mod assist            Ambulation/Gait Ambulation/Gait assistance: Min guard Ambulation Distance (Feet): 100 Feet Assistive device: Rolling walker (2 wheeled) Gait Pattern/deviations: Decreased step length - left;Decreased stance time - right;Drifts right/left   Gait velocity interpretation: <1.8 ft/sec, indicative of risk for recurrent falls General Gait Details: drifts left   Stairs            Wheelchair Mobility    Modified Rankin (Stroke Patients Only)       Balance                                             Pertinent Vitals/Pain Pain Assessment: No/denies pain    Home Living Family/patient expects to be discharged to:: Private residence Living Arrangements: Children Available Help at Discharge: Family Type of Home: House Home Access: Stairs to enter   Secretary/administrator of Steps: 4 Home Layout: One level Home Equipment: Environmental consultant - 2 wheels Additional Comments: PT will most likely do better with a platform walker     Prior Function Level of Independence: Needs assistance   Gait / Transfers Assistance Needed: mod I with walker  ADL's / Homemaking Assistance Needed: friend completes housework and cooking        Hand Dominance        Extremity/Trunk Assessment  Lower Extremity Assessment Lower Extremity Assessment: Generalized weakness       Communication   Communication: No difficulties  Cognition Arousal/Alertness: Awake/alert Behavior During Therapy: WFL for tasks assessed/performed Overall Cognitive Status: Within Functional Limits for tasks assessed                      General Comments      Exercises General Exercises - Lower Extremity Heel Slides:  (bridging x 10) Hip ABduction/ADduction: Both;10 reps Straight Leg Raises: Both;10 reps   Assessment/Plan    PT Assessment Patient needs continued PT services  PT Problem List Decreased strength;Decreased range of motion;Decreased balance;Decreased  mobility          PT Treatment Interventions Gait training;Therapeutic exercise;Stair training;Functional mobility training;Patient/family education;Therapeutic activities    PT Goals (Current goals can be found in the Care Plan section)  Acute Rehab PT Goals Patient Stated Goal: to get stronger PT Goal Formulation: With patient Time For Goal Achievement: 05/17/16 Potential to Achieve Goals: Good    Frequency Min 5X/week   Barriers to discharge        Co-evaluation               End of Session Equipment Utilized During Treatment: Gait belt Activity Tolerance: Patient tolerated treatment well Patient left: in chair;with chair alarm set;with family/visitor present           Time: 1420-1506 PT Time Calculation (min) (ACUTE ONLY): 46 min   Charges:   PT Evaluation $PT Eval Moderate Complexity: 1 Procedure PT Treatments $Gait Training: 8-22 mins $Therapeutic Exercise: 8-22 mins   PT G CodesVirgina Bruce:        Juri Dinning, PT CLT [phone] 05/12/2016, 3:07 PM

## 2016-05-12 NOTE — Consult Note (Signed)
[place] A. Merlene Laughter, MD     [email or web address]          Ronald Bruce is an 81 y.o. male.   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 The etiology is most likely due to acute MI with embolic phenomena or atrial fibrillation. Valve disease is also an issue. The patient is to be kept off antiplatelet agents and anticoagulation for the next month. He will need a workup for cardioembolic stroke including echocardiography and possible TEE, 30 day event monitor and possible loop recorder.  Suspect the patient likely also has mild to moderate cognitive impairment at baseline. Dementia labs will be obtained.   The patient is 81 year old white male who presents with episodes of falling. The patient seemed to have limited recollection about what happened. He reports that he fell and also he remembers. He doesn't remembers the circumstances of how he fell. He has limited insight and recollection of anything else. He reports that he is feeling well at this time. The patient does not report having focal numbness, weakness, dysarthria, dysphagia, chest pain or shortness of breath. His son is present in things that he may be at baseline at this time. The son relates that he had a significant stroke a few years ago with resultant left hemiparesis. This seems to have remained unchanged. The review systems otherwise negative.    GENERAL: Pleasant average weight male in no acute distress.  HEENT: Supple. Atraumatic normocephalic.   ABDOMEN: soft  EXTREMITIES: No edema   BACK: Normal.  SKIN: Normal by inspection.    MENTAL STATUS: He is awake and cooperative with evaluation. He knows that he is in the hospital. He is not oriented to time. He is not oriented to his age.   CRANIAL NERVES: Pupils are equal, round and reactive to light and accommodation; extra ocular movements are full, there is no significant nystagmus; visual fields are full;  upper and lower facial muscles are normal in strength and symmetric, there is no flattening of the nasolabial folds; tongue is midline; uvula is midline; shoulder elevation is normal.  MOTOR: Left upper extremity: Deltoid and triceps 4 minus/5 hand grip 5. Mild drift. Left lower extremity hip flexion 3/5 and dorsiflexion also 3/5. Marked drift. Right upper extremity normal tone and bulk and strength and no drift. Right lower extremity 4/5 and the marked drift.  COORDINATION: Left finger to nose is normal, right finger to nose is normal, No rest tremor; no intention tremor; no postural tremor; no bradykinesia.  REFLEXES: Deep tendon reflexes are symmetrical and normal. Babinski reflexes are flexor bilaterally.   SENSATION: Normal to light touch and pain. No extinction to double simultaneous stimulation but this was difficult as the patient seemed to have left right disorientation. No extinction to visual double simultaneous stimulation.    NIH stroke scale 2, 5, total 7.   The patient's brain MRI is reviewed in person. There are multiple small and moderate-sized acute lesion seen on DWI involving the cerebellum and cerebrum bilaterally. These are mostly deep white matter infarcts. 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 Evidence of a moderate size hemorrhages seen with reduced signal on SWI involving the right posterior frontal parietal region. This area is also associated with increased signal on DWI indicating likely hemorrhagic transformation of an ischemic stroke.    CT is also reviewed and shows a tiny increased signal/hyperdensity involving the right frontal area demonstrate with the findings on SWI an MRI scan. CT also shows  marked leukoencephalopathy even more pronounced than on MRI.   Blood pressure (!) 144/64, pulse 75, temperature 98 F (36.7 C), temperature source Oral, resp. rate 20, height 6' (1.829 m), weight 154 lb 11.2 oz (70.2 kg), SpO2 96  %.  Past Medical History:  Diagnosis Date  . Hypertension   . Stroke Ronald Bruce)     History reviewed. No pertinent surgical history.  Family History  Problem Relation Age of Onset  . Hypertension Mother   . Pneumonia Mother   . Lung cancer Father     Social History:  reports that he has never smoked. He has never used smokeless tobacco. He reports that he drinks about 2.4 - 3.0 oz of alcohol per week . He reports that he does not use drugs.  Allergies: No Known Allergies  Medications: Prior to Admission medications   Medication Sig Start Date End Date Taking? Authorizing Provider  ALPRAZolam Duanne Moron) 1 MG tablet Take 1 mg by mouth 3 (three) times daily as needed for anxiety.   Yes Historical Provider, MD  amLODipine-valsartan (EXFORGE) 10-320 MG tablet Take 1 tablet by mouth daily.   Yes Historical Provider, MD  cholecalciferol (VITAMIN D) 1000 units tablet Take 1,000 Units by mouth daily.   Yes Historical Provider, MD  clopidogrel (PLAVIX) 75 MG tablet Take 75 mg by mouth daily. 05/01/16  Yes Historical Provider, MD  escitalopram (LEXAPRO) 20 MG tablet Take 20 mg by mouth daily.   Yes Historical Provider, MD  nebivolol (BYSTOLIC) 10 MG tablet Take 10 mg by mouth daily.   Yes Historical Provider, MD  tamsulosin (FLOMAX) 0.4 MG CAPS capsule Take 0.4 mg by mouth daily.   Yes Historical Provider, MD    Scheduled Meds: . amLODipine  10 mg Oral Daily   And  . irbesartan  300 mg Oral Daily  . atorvastatin  40 mg Oral q1800  . escitalopram  20 mg Oral Daily  . nebivolol  10 mg Oral Daily  . sodium chloride flush  3 mL Intravenous Q12H  . tamsulosin  0.4 mg Oral Daily   Continuous Infusions: . dextrose 5 % and 0.9 % NaCl with KCl 20 mEq/L 75 mL/hr at 05/12/16 0023   PRN Meds:.ALPRAZolam     Results for orders placed or performed during the hospital encounter of 05/11/16 (from the past 48 hour(s))  CBG monitoring, ED     Status: Abnormal   Collection Time: 05/11/16  4:43 PM    Result Value Ref Range   Glucose-Capillary 156 (H) 65 - 99 mg/dL  Comprehensive metabolic panel     Status: Abnormal   Collection Time: 05/11/16  5:04 PM  Result Value Ref Range   Sodium 142 135 - 145 mmol/L   Potassium 3.0 (L) 3.5 - 5.1 mmol/L   Chloride 107 101 - 111 mmol/L   CO2 26 22 - 32 mmol/L   Glucose, Bld 167 (H) 65 - 99 mg/dL   BUN 26 (H) 6 - 20 mg/dL   Creatinine, Ser 1.06 0.61 - 1.24 mg/dL   Calcium 9.2 8.9 - 10.3 mg/dL   Total Protein 7.0 6.5 - 8.1 g/dL   Albumin 3.6 3.5 - 5.0 g/dL   AST 22 15 - 41 U/L   ALT 8 (L) 17 - 63 U/L   Alkaline Phosphatase 367 (H) 38 - 126 U/L   Total Bilirubin 0.7 0.3 - 1.2 mg/dL   GFR calc non Af Amer >60 >60 mL/min   GFR calc Af Amer >60 >60 mL/min  Comment: (NOTE) The eGFR has been calculated using the CKD EPI equation. This calculation has not been validated in all clinical situations. eGFR's persistently <60 mL/min signify possible Chronic Kidney Disease.    Anion gap 9 5 - 15  CBC     Status: Abnormal   Collection Time: 05/11/16  5:04 PM  Result Value Ref Range   WBC 7.2 4.0 - 10.5 K/uL   RBC 5.03 4.22 - 5.81 MIL/uL   Hemoglobin 15.4 13.0 - 17.0 g/dL   HCT 46.6 39.0 - 52.0 %   MCV 92.6 78.0 - 100.0 fL   MCH 30.6 26.0 - 34.0 pg   MCHC 33.0 30.0 - 36.0 g/dL   RDW 12.9 11.5 - 15.5 %   Platelets 104 (L) 150 - 400 K/uL    Comment: SPECIMEN CHECKED FOR CLOTS LARGE PLATELETS PRESENT GIANT PLATELETS SEEN PLATELET COUNT CONFIRMED BY SMEAR   Rapid urine drug screen (hospital performed)     Status: None   Collection Time: 05/11/16  6:19 PM  Result Value Ref Range   Opiates NONE DETECTED NONE DETECTED   Cocaine NONE DETECTED NONE DETECTED   Benzodiazepines NONE DETECTED NONE DETECTED   Amphetamines NONE DETECTED NONE DETECTED   Tetrahydrocannabinol NONE DETECTED NONE DETECTED   Barbiturates NONE DETECTED NONE DETECTED    Comment:        DRUG SCREEN FOR MEDICAL PURPOSES ONLY.  IF CONFIRMATION IS NEEDED FOR ANY PURPOSE,  NOTIFY LAB WITHIN 5 DAYS.        LOWEST DETECTABLE LIMITS FOR URINE DRUG SCREEN Drug Class       Cutoff (ng/mL) Amphetamine      1000 Barbiturate      200 Benzodiazepine   297 Tricyclics       989 Opiates          300 Cocaine          300 THC              50   Urinalysis, Routine w reflex microscopic     Status: Abnormal   Collection Time: 05/11/16  6:20 PM  Result Value Ref Range   Color, Urine YELLOW YELLOW   APPearance CLEAR CLEAR   Specific Gravity, Urine 1.019 1.005 - 1.030   pH 5.0 5.0 - 8.0   Glucose, UA NEGATIVE NEGATIVE mg/dL   Hgb urine dipstick SMALL (A) NEGATIVE   Bilirubin Urine NEGATIVE NEGATIVE   Ketones, ur NEGATIVE NEGATIVE mg/dL   Protein, ur 100 (A) NEGATIVE mg/dL   Nitrite NEGATIVE NEGATIVE   Leukocytes, UA NEGATIVE NEGATIVE   RBC / HPF 0-5 0 - 5 RBC/hpf   WBC, UA 0-5 0 - 5 WBC/hpf   Bacteria, UA NONE SEEN NONE SEEN   Mucous PRESENT    Hyaline Casts, UA PRESENT   Ethanol     Status: None   Collection Time: 05/11/16  6:27 PM  Result Value Ref Range   Alcohol, Ethyl (B) <5 <5 mg/dL    Comment:        LOWEST DETECTABLE LIMIT FOR SERUM ALCOHOL IS 5 mg/dL FOR MEDICAL PURPOSES ONLY   CK     Status: None   Collection Time: 05/11/16  6:27 PM  Result Value Ref Range   Total CK 146 49 - 397 U/L  Troponin I     Status: Abnormal   Collection Time: 05/11/16  6:27 PM  Result Value Ref Range   Troponin I 1.32 (HH) <0.03 ng/mL    Comment: CRITICAL RESULT CALLED  TO, READ BACK BY AND VERIFIED WITH: DOSS,M ON 05/11/16 AT 1900 BY LOY,C   TSH     Status: None   Collection Time: 05/11/16  6:27 PM  Result Value Ref Range   TSH 2.125 0.350 - 4.500 uIU/mL    Comment: Performed by a 3rd Generation assay with a functional sensitivity of <=0.01 uIU/mL.  Ammonia     Status: None   Collection Time: 05/11/16  7:27 PM  Result Value Ref Range   Ammonia 23 9 - 35 umol/L  Troponin I     Status: Abnormal   Collection Time: 05/11/16 11:49 PM  Result Value Ref Range    Troponin I 1.31 (HH) <0.03 ng/mL    Comment: CRITICAL VALUE NOTED.  VALUE IS CONSISTENT WITH PREVIOUSLY REPORTED AND CALLED VALUE.  Lipid panel     Status: Abnormal   Collection Time: 05/12/16  5:41 AM  Result Value Ref Range   Cholesterol 120 0 - 200 mg/dL   Triglycerides 145 <150 mg/dL   HDL 18 (L) >40 mg/dL   Total CHOL/HDL Ratio 6.7 RATIO   VLDL 29 0 - 40 mg/dL   LDL Cholesterol 73 0 - 99 mg/dL    Comment:        Total Cholesterol/HDL:CHD Risk Coronary Heart Disease Risk Table                     Men   Women  1/2 Average Risk   3.4   3.3  Average Risk       5.0   4.4  2 X Average Risk   9.6   7.1  3 X Average Risk  23.4   11.0        Use the calculated Patient Ratio above and the CHD Risk Table to determine the patient's CHD Risk.        ATP III CLASSIFICATION (LDL):  <100     mg/dL   Optimal  100-129  mg/dL   Near or Above                    Optimal  130-159  mg/dL   Borderline  160-189  mg/dL   High  >190     mg/dL   Very High   Troponin I     Status: Abnormal   Collection Time: 05/12/16  5:44 AM  Result Value Ref Range   Troponin I 1.20 (HH) <0.03 ng/mL    Comment: CRITICAL VALUE NOTED.  VALUE IS CONSISTENT WITH PREVIOUSLY REPORTED AND CALLED VALUE.  Basic metabolic panel     Status: Abnormal   Collection Time: 05/12/16  5:44 AM  Result Value Ref Range   Sodium 142 135 - 145 mmol/L   Potassium 2.9 (L) 3.5 - 5.1 mmol/L   Chloride 109 101 - 111 mmol/L   CO2 26 22 - 32 mmol/L   Glucose, Bld 161 (H) 65 - 99 mg/dL   BUN 21 (H) 6 - 20 mg/dL   Creatinine, Ser 0.77 0.61 - 1.24 mg/dL   Calcium 8.6 (L) 8.9 - 10.3 mg/dL   GFR calc non Af Amer >60 >60 mL/min   GFR calc Af Amer >60 >60 mL/min    Comment: (NOTE) The eGFR has been calculated using the CKD EPI equation. This calculation has not been validated in all clinical situations. eGFR's persistently <60 mL/min signify possible Chronic Kidney Disease.    Anion gap 7 5 - 15    Studies/Results:  BRAIN  MRA FINDINGS: Both vertebral arteries patent to the basilar without significant stenosis. PICA patent. Mild irregularity of the basilar without significant stenosis. Superior cerebellar arteries patent bilaterally. Irregularity and moderate stenosis in the mid right posterior cerebral artery. Moderate stenosis distal left posterior cerebral artery.  Mild atherosclerotic irregularity and stenosis in the cavernous carotid bilaterally. Single A2 segment. Anterior cerebral arteries patent bilaterally. Middle cerebral artery patent bilaterally. Mild irregularity and stenosis and right M2 segment.  Negative for cerebral aneurysm.  IMPRESSION: Intracranial atherosclerotic disease mild to moderate in degree. No large vessel occlusion.      BRAIN MRI FINDINGS: Brain: Numerous small areas of acute infarct bilaterally. These are present throughout the frontal parietal cortex. The largest area is in the right parietal cortex. Small acute infarct in the pons. Multiple small areas of acute infarct in the cerebellum bilaterally.  6 x 10 mm hemorrhage in the right parietal white matter. This is high density on CT and appears acute and related to acute infarction. Other areas of chronic hemorrhage include the left temporal parietal lobe and right basal ganglia. Negative for mass or edema.  Chronic microvascular ischemic change in the white matter, right greater than left  Generalized moderate atrophy.  Negative for hydrocephalus.  Vascular: Normal arterial flow voids.  Skull and upper cervical spine: Negative  Sinuses/Orbits: Mild mucosal edema in the paranasal sinuses. Left lens replacement. No orbital mass.  Other: None  IMPRESSION: Numerous small areas of acute cerebral infarction throughout both cerebral hemispheres and cerebellum bilaterally. Pattern most consistent with cerebral emboli. 1 cm area of acute hemorrhage in the right parietal white matter as  identified on CT.      HEAD CT 1. 9 mm focal cortical/subcortical hemorrhage in the lateral aspect of the posterior right frontal lobe subjacent to the precentral gyrus. This likely reflects a small parenchymal hemorrhage. Underlying lesion is not excluded. 2. Remote infarct involving the right lentiform nucleus, anterior insular ribbon, and and superior white matter. 3. Moderate diffuse atrophy and white matter disease likely reflects the sequela of chronic microvascular ischemia.         Ronald Bruce A. Merlene Laughter, M.D.  Diplomate, Tax adviser of Psychiatry and Neurology ( Neurology). 05/12/2016, 9:16 AM

## 2016-05-12 NOTE — Evaluation (Signed)
Occupational Therapy Evaluation Patient Details Name: Nazaiah Navarrete MRN: [id number] DOB: Jul 01, 1935 Today's Date: 05/12/2016    History of Present Illness Niles Ess is an 81 y.o. 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  Work up in the ER included a head CT which showed a small intracebral hemorrhage, appeared not acute, along prior stroke.  He was found to have a troponin of 1.32.  EKG showed T wave inversion on precordial leads, but no acute changes.  He also has a K of 3.0, normal renal fx test, normal NH3, and a clear CXR, with fx rib.  Neurology was called, who felt that his intracranial blood is likely old.  Hospitalist was asked to admit him for further work up of intracranial bleed, elevated troponin, and failure to thrive. He has no fever, chills, nausea, vomiting, HA, and he is conversing normally   Clinical Impression   Pt is awake, alert, agreeable to OT evaluation. Pt reports he has a caregiver at home who assists with ADL completion, however pt is normally able to complete majority of tasks with mod independence; no caregiver available to confirm. Pt with LUE strength, ROM, and coordination deficits, reportedly from prior CVA, is able to use LUE to assist in ADL completion with increased time. LUE sensation is poor, appears to display signs of left neglect and/or left visual field deficits. Unclear if pt cognition is at baseline, does display confusion at times during session. Pt demonstrates deficits in strength, activity tolerance, balance, and coordination required for ADL completion, recommend SNF on discharge to improve safety and independence in ADL and functional mobility tasks.     Follow Up Recommendations  SNF    Equipment Recommendations  None recommended by OT       Precautions /  Restrictions Precautions Precautions: Fall Restrictions Weight Bearing Restrictions: No      Mobility Bed Mobility   General bed mobility comments: not tested-pt up in chair on OT arrival  Transfers Overall transfer level: Needs assistance Equipment used: Rolling walker (2 wheeled) Transfers: Sit to/from Stand Sit to Stand: Mod assist                   ADL Overall ADL's : Needs assistance/impaired     Grooming: Set up;Sitting Grooming Details (indicate cue type and reason): Verbal cuing to attend to left side of body             Lower Body Dressing: Moderate assistance;Sitting/lateral leans Lower Body Dressing Details (indicate cue type and reason): Pt able to doff socks with mod I, requires assistance for donning LB clothing                     Vision Vision Assessment?: Yes Eye Alignment: Within Functional Limits Ocular Range of Motion: Within Functional Limits Alignment/Gaze Preference: Within Defined Limits Tracking/Visual Pursuits: Decreased smoothness of horizontal tracking Saccades: Within functional limits Convergence: Within functional limits Visual Fields: Left homonymous hemianopsia;Other (comment) (unsure if from prior CVA; no family available to clarify)   Perception     Praxis      Pertinent Vitals/Pain Pain Assessment: No/denies pain     Hand Dominance Right   Extremity/Trunk Assessment Upper Extremity Assessment Upper Extremity Assessment: LUE deficits/detail LUE Deficits / Details: left hemiparesis from prior CVA: strength 3-/5 in shoulder, 4-/5  elbow, 3/5 wrist and grip; sensation deficits LUE Coordination: decreased fine motor;decreased gross motor   Lower Extremity Assessment Lower Extremity Assessment: Defer to PT evaluation       Communication Communication Communication: No difficulties   Cognition Arousal/Alertness: Awake/alert Behavior During Therapy: WFL for tasks assessed/performed Overall Cognitive  Status: No family/caregiver present to determine baseline cognitive functioning                                Home Living Family/patient expects to be discharged to:: Private residence Living Arrangements: Children Available Help at Discharge: Family Type of Home: House Home Access: Stairs to enter Secretary/administratorntrance Stairs-Number of Steps: 4   Home Layout: One level     Bathroom Shower/Tub: Chief Strategy OfficerTub/shower unit   Bathroom Toilet: Standard     Home Equipment: Environmental consultantWalker - 2 wheels   Additional Comments: PT will most likely do better with a platform walker       Prior Functioning/Environment Level of Independence: Needs assistance  Gait / Transfers Assistance Needed: mod I with walker ADL's / Homemaking Assistance Needed: Pt reports "helper" (aide?) comes for a few hours each day to assist with dressing/bathing/meal preparation and housework tasks Communication / Swallowing Assistance Needed: i          OT Problem List: Decreased strength;Decreased activity tolerance;Impaired balance (sitting and/or standing);Decreased cognition;Impaired UE functional use                       End of Session Equipment Utilized During Treatment: Gait belt;Rolling walker  Activity Tolerance: Patient tolerated treatment well Patient left: in chair;with call bell/phone within reach;with chair alarm set   Time: 1547-1610 OT Time Calculation (min): 23 min Charges:  OT General Charges $OT Visit: 1 Procedure OT Evaluation $OT Eval Low Complexity: 1 Procedure Ezra SitesLeslie Troxler, OTR/L  [phone] 05/12/2016, 4:24 PM

## 2016-05-12 NOTE — Consult Note (Addendum)
Primary cardiologist:N/A Consulting cardiologist: Dr Carlyle Dolly Requesting: Dr Carolin Sicks Indication: elevated troponin  Clinical Summary Mr. Ronald Bruce is a 81 y.o.male history of prior CVA admitted with general malaise and weakness. CT head showed nonacute intracerebral hemorrhage as well prior CVA. Patient is a poor historian, he is not sure why he came to the hospital. He denies any chest pain or SOB.   K 3, Cr 1.06, alk phos 367, WBC 7.2, Hgb 15.4, TSH 2.1,  Trop 1.32-->1.31-->1.20-->1.13--> CT head 9 mm cortical/subcortical hemorrhage, remote right lentiform nucleus infarct, modrate diffuse atrophy MRI brain numerous small acute cerebral infarctions suggesting emboli, 1 cm area of acute hemorrhage right parietal.  Echo pending EKG SR, inferior and anteoseptal Qwaves.   No Known Allergies  Medications Scheduled Medications: . amLODipine  10 mg Oral Daily   And  . irbesartan  300 mg Oral Daily  . atorvastatin  40 mg Oral q1800  . escitalopram  20 mg Oral Daily  . famotidine (PEPCID) IV  20 mg Intravenous Q12H  . nebivolol  10 mg Oral Daily  . potassium chloride (KCL MULTIRUN) 30 mEq in 265 mL IVPB  30 mEq Intravenous Once  . sodium chloride flush  3 mL Intravenous Q12H  . tamsulosin  0.4 mg Oral Daily     Infusions: . dextrose 5 % and 0.9 % NaCl with KCl 20 mEq/L 100 mL/hr at 05/12/16 1556     PRN Medications:  ALPRAZolam   Past Medical History:  Diagnosis Date  . Hypertension   . Stroke [facility])     History reviewed. No pertinent surgical history.  Family History  Problem Relation Age of Onset  . Hypertension Mother   . Pneumonia Mother   . Lung cancer Father     Social History Mr. Gantt reports that he has never smoked. He has never used smokeless tobacco. Mr. Goodrich reports that he drinks about 2.4 - 3.0 oz of alcohol per week .  Review of Systems CONSTITUTIONAL: No weight loss, fever, chills, weakness or fatigue.  HEENT: Eyes: No visual loss,  blurred vision, double vision or yellow sclerae. No hearing loss, sneezing, congestion, runny nose or sore throat.  SKIN: No rash or itching.  CARDIOVASCULAR: No chest pain, chest pressure or chest discomfort. No palpitations or edema.  RESPIRATORY: No shortness of breath, cough or sputum.  GASTROINTESTINAL: No anorexia, nausea, vomiting or diarrhea. No abdominal pain or blood.  GENITOURINARY: no polyuria, no dysuria NEUROLOGICAL: No headache, dizziness, syncope, paralysis, ataxia, numbness or tingling in the extremities. No change in bowel or bladder control.  MUSCULOSKELETAL: No muscle, back pain, joint pain or stiffness.  HEMATOLOGIC: No anemia, bleeding or bruising.  LYMPHATICS: No enlarged nodes. No history of splenectomy.  PSYCHIATRIC: No history of depression or anxiety.      Physical Examination Blood pressure (!) 160/65, pulse 66, temperature 97.8 F (36.6 C), temperature source Oral, resp. rate 18, height 6' (1.829 m), weight 154 lb 11.2 oz (70.2 kg), SpO2 98 %.  Intake/Output Summary (Last 24 hours) at 05/12/16 1611 Last data filed at 05/12/16 1500  Gross per 24 hour  Intake          1646.25 ml  Output                0 ml  Net          1646.25 ml    HEENT: sclera clear, throat clear  Cardiovascular: RRR, no m/r/g, no jvd  Respiratory: CTAB  GI: abdomen soft,  NT, ND  MSK: no LE edema  Neuro: A&Ox1, no focal motor deficits  Psych: appropriae affect   Lab Results  Basic Metabolic Panel:  Recent Labs Lab 05/11/16 1704 05/12/16 0544  NA 142 142  K 3.0* 2.9*  CL 107 109  CO2 26 26  GLUCOSE 167* 161*  BUN 26* 21*  CREATININE 1.06 0.77  CALCIUM 9.2 8.6*    Liver Function Tests:  Recent Labs Lab 05/11/16 1704  AST 22  ALT 8*  ALKPHOS 367*  BILITOT 0.7  PROT 7.0  ALBUMIN 3.6    CBC:  Recent Labs Lab 05/11/16 1704  WBC 7.2  HGB 15.4  HCT 46.6  MCV 92.6  PLT 104*    Cardiac Enzymes:  Recent Labs Lab 05/11/16 1827 05/11/16 2349  05/12/16 0544 05/12/16 1120  CKTOTAL 146  --   --   --   TROPONINI 1.32* 1.31* 1.20* 1.13*    BNP: Invalid input(s): POCBNP     Impression/Recommendations 1. Elevated troponin/NSTEMI - significant troponin elevation on admission. No specific cardiopulmonary symptoms, he presented with generalized weakness and failure to thrive. EKG shows inferior and anteroseptal Qwaves, no previous EKG to compare - medical therapy limited by intracranial bleed. 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 If embolic may have also embolized to coronaries explaining his elevated troponin.  - f/u echo. Given his head bleed, advanced age, lack of cardiopulmonary symptoms, and poor functional status would not consider cath. Defer if ASA ok to neuro. No heparin. Continue statin, beta blocker, ARB.     Carlyle Dolly, M.D.

## 2016-05-12 NOTE — Progress Notes (Signed)
SLP Cancellation Note  Patient Details Name: Ronald Bruce MRN: [id number] DOB: 12/13/1935   Cancelled treatment:        Pt appeared on SLP caseload, however there is no order attached for SLP. I do see that Dr. Conley RollsLe ordered MBSS through radiology, however. Pt passed RN swallow screen and was started on a diet. If MD would like SLP consult (either for SLE or BSE), please order. Above to RN, [name].  Thank you,  [name], [name] [phone]    [name] 05/12/2016, 6:59 PM

## 2016-05-12 NOTE — Care Management Note (Signed)
Case Management Note  Patient Details  Name: Ronald Bruce MRN: [id number] Date of Birth: 10/20/1935  Subjective/Objective:                  Pt admitted with CVA. He is from home with caregiver. PT has seen pt and recommends SNF. CSW is aware and will working with pt/family on placement.   Action/Plan: No CM needs anticipated.   Expected Discharge Date:     05/16/2015             Expected Discharge Plan:  Skilled Nursing Facility  In-House Referral:  Clinical Social Work  Discharge planning Services  CM Consult  Post Acute Care Choice:  NA Choice offered to:  NA  Status of Service:  Completed, signed off  Malcolm MetroChildress, Rawad Bochicchio Demske, RN 05/12/2016, 3:09 PM

## 2016-05-13 ENCOUNTER — Inpatient Hospital Stay (HOSPITAL_COMMUNITY): Payer: Medicare HMO

## 2016-05-13 DIAGNOSIS — R748 Abnormal levels of other serum enzymes: Secondary | ICD-10-CM

## 2016-05-13 DIAGNOSIS — R627 Adult failure to thrive: Secondary | ICD-10-CM

## 2016-05-13 DIAGNOSIS — R7989 Other specified abnormal findings of blood chemistry: Secondary | ICD-10-CM

## 2016-05-13 LAB — BASIC METABOLIC PANEL
Anion gap: 7 (ref 5–15)
BUN: 16 mg/dL (ref 6–20)
CO2: 25 mmol/L (ref 22–32)
CREATININE: 0.87 mg/dL (ref 0.61–1.24)
Calcium: 8.4 mg/dL — ABNORMAL LOW (ref 8.9–10.3)
Chloride: 112 mmol/L — ABNORMAL HIGH (ref 101–111)
GFR calc non Af Amer: >60 mL/min (ref >60)
GLUCOSE: 181 mg/dL — AB (ref 65–99)
Potassium: 3.3 mmol/L — ABNORMAL LOW (ref 3.5–5.1)
Sodium: 144 mmol/L (ref 135–145)

## 2016-05-13 LAB — ECHOCARDIOGRAM COMPLETE
AVLVOTPG: 4 mmHg
AVPHT: 472 ms
CHL CUP MV DEC (S): 257
E/e' ratio: 11.07
EWDT: 257 ms
FS: 29 % (ref 28–44)
Height: 72 in
IV/PV OW: 0.98
LA ID, A-P, ES: 47 mm
LA diam end sys: 47 mm
LA vol index: 42.6 mL/m2
LA vol: 80.1 mL
LADIAMINDEX: 2.5 cm/m2
LAVOLA4C: 60.2 mL
LV E/e' medial: 11.07
LV E/e'average: 11.07
LV TDI E'LATERAL: 4.13
LV TDI E'MEDIAL: 5.44
LVDIAVOL: 99 mL (ref 62–150)
LVDIAVOLIN: 53 mL/m2
LVELAT: 4.13 cm/s
LVOT SV: 76 mL
LVOT VTI: 22 cm
LVOT area: 3.46 cm2
LVOTD: 21 mm
LVOTPV: 101 cm/s
LVSYSVOL: 46 mL (ref 21–61)
LVSYSVOLIN: 24 mL/m2
MV pk A vel: 87.9 m/s
MVPKEVEL: 45.7 m/s
PW: 11.4 mm — AB (ref 0.6–1.1)
RV LATERAL S' VELOCITY: 13.9 cm/s
Simpson's disk: 54
Stroke v: 54 ml
TAPSE: 21.3 mm
Weight: 2475.2 oz

## 2016-05-13 LAB — TROPONIN I: Troponin I: 1.44 ng/mL (ref <0.03)

## 2016-05-13 LAB — RPR: RPR Ser Ql: NONREACTIVE

## 2016-05-13 LAB — MAGNESIUM: Magnesium: 1.7 mg/dL (ref 1.7–2.4)

## 2016-05-13 MED ORDER — CYANOCOBALAMIN 1000 MCG/ML IJ SOLN
1000.0000 ug | INTRAMUSCULAR | Status: AC
  Administered 2016-05-13: 1000 ug via INTRAMUSCULAR
  Filled 2016-05-13: qty 1

## 2016-05-13 MED ORDER — POTASSIUM CHLORIDE 10 MEQ/100ML IV SOLN
10.0000 meq | INTRAVENOUS | Status: AC
  Administered 2016-05-13 (×3): 10 meq via INTRAVENOUS
  Filled 2016-05-13 (×3): qty 100

## 2016-05-13 MED ORDER — MAGNESIUM SULFATE 2 GM/50ML IV SOLN
2.0000 g | Freq: Once | INTRAVENOUS | Status: AC
  Administered 2016-05-13: 2 g via INTRAVENOUS
  Filled 2016-05-13: qty 50

## 2016-05-13 MED ORDER — LORAZEPAM 2 MG/ML IJ SOLN
1.0000 mg | Freq: Once | INTRAMUSCULAR | Status: AC
  Administered 2016-05-13: 1 mg via INTRAVENOUS
  Filled 2016-05-13: qty 1

## 2016-05-13 NOTE — Clinical Social Work Placement (Signed)
   CLINICAL SOCIAL WORK PLACEMENT  NOTE  Date:  05/13/2016  Patient Details  Name: Ronald Bruce MRN: [id number] Date of Birth: 06/27/1935  Clinical Social Work is seeking post-discharge placement for this patient at the Skilled  Nursing Facility level of care (*CSW will initial, date and re-position this form in  chart as items are completed):  Yes   Patient/family provided with [facility] Clinical Social Work Department's list of facilities offering this level of care within the geographic area requested by the patient (or if unable, by the patient's family).      Patient/family informed of their freedom to choose among providers that offer the needed level of care, that participate in Medicare, Medicaid or managed care program needed by the patient, have an available bed and are willing to accept the patient.  Yes   Patient/family informed of [facility]'s ownership interest in [facility] and [facility], as well as of the fact that they are under no obligation to receive care at these facilities.  PASRR submitted to EDS on 05/13/16     PASRR number received on 05/13/16     Existing PASRR number confirmed on       FL2 transmitted to all facilities in geographic area requested by pt/family on 05/13/16     FL2 transmitted to all facilities within larger geographic area on       Patient informed that his/her managed care company has contracts with or will negotiate with certain facilities, including the following:            Patient/family informed of bed offers received.  Patient chooses bed at       Physician recommends and patient chooses bed at      Patient to be transferred to   on  .  Patient to be transferred to facility by       Patient family notified on   of transfer.  Name of family member notified:        PHYSICIAN       Additional Comment:    _______________________________________________ Annice NeedySettle, Airel Magadan D, LCSW 05/13/2016, 12:50 PM

## 2016-05-13 NOTE — Progress Notes (Signed)
Subjective:    No complaints this AM  Objective:   Temp:  [97.8 F (36.6 C)-98.9 F (37.2 C)] 98.4 F (36.9 C) (01/05 0524) Pulse Rate:  [64-74] 64 (01/05 0524) Resp:  [18-20] 20 (01/05 0524) BP: (133-160)/(54-65) 155/62 (01/05 0524) SpO2:  [93 %-98 %] 97 % (01/05 0524) Last BM Date: 05/10/16  Filed Weights   05/11/16 1624 05/11/16 2332  Weight: 198 lb (89.8 kg) 154 lb 11.2 oz (70.2 kg)    Intake/Output Summary (Last 24 hours) at 05/13/16 0949 Last data filed at 05/13/16 0343  Gross per 24 hour  Intake          2351.33 ml  Output              300 ml  Net          2051.33 ml    Telemetry: SR  Exam:  General: NAD  HEENT: sclera clear, throat clear  Resp: CTAB  Cardiac: RRR, no m/r/g, no jvd  GI: abdomen soft, NT, ND  MSK: no LE edema  Neuro: no focal deficits  Psych: appropriate affect  Lab Results:  Basic Metabolic Panel:  Recent Labs Lab 05/11/16 1704 05/12/16 0544 05/12/16 1120 05/13/16 0632  NA 142 142  --  144  K 3.0* 2.9* 3.2* 3.3*  CL 107 109  --  112*  CO2 26 26  --  25  GLUCOSE 167* 161*  --  181*  BUN 26* 21*  --  16  CREATININE 1.06 0.77  --  0.87  CALCIUM 9.2 8.6*  --  8.4*  MG  --   --   --  1.7    Liver Function Tests:  Recent Labs Lab 05/11/16 1704  AST 22  ALT 8*  ALKPHOS 367*  BILITOT 0.7  PROT 7.0  ALBUMIN 3.6    CBC:  Recent Labs Lab 05/11/16 1704  WBC 7.2  HGB 15.4  HCT 46.6  MCV 92.6  PLT 104*    Cardiac Enzymes:  Recent Labs Lab 05/11/16 1827  05/12/16 0544 05/12/16 1120 05/13/16 0632  CKTOTAL 146  --   --   --   --   TROPONINI 1.32*  < > 1.20* 1.13* 1.44*  < > = values in this interval not displayed.  BNP: No results for input(s): PROBNP in the last 8760 hours.  Coagulation: No results for input(s): INR in the last 168 hours.  ECG:   Medications:   Scheduled Medications: .  stroke: mapping our early stages of recovery book   Does not apply Once  . amLODipine  10 mg Oral Daily    And  . irbesartan  300 mg Oral Daily  . atorvastatin  40 mg Oral q1800  . escitalopram  20 mg Oral Daily  . famotidine (PEPCID) IV  20 mg Intravenous Q12H  . nebivolol  10 mg Oral Daily  . potassium chloride  10 mEq Intravenous Q1 Hr x 3  . sodium chloride flush  3 mL Intravenous Q12H  . tamsulosin  0.4 mg Oral Daily     Infusions: . dextrose 5 % and 0.9 % NaCl with KCl 20 mEq/L 100 mL/hr at 05/13/16 0109     PRN Medications:  ALPRAZolam     Assessment/Plan    1. Elevated troponin/NSTEMI - significant troponin elevation on admission. No specific cardiopulmonary symptoms, he presented with generalized weakness and failure to thrive. EKG shows inferior and anteroseptal Qwaves, no previous EKG to compare - medical therapy limited by  intracranial bleed. 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 If embolic may have also embolized to coronaries explaining his elevated troponin.  - f/u echo. Given his head bleed, advanced age, lack of cardiopulmonary symptoms, and poor functional status would not consider cath.Neurology has recommended avoiding antiplatelet or anticoagulation for 1 month. In this clinical situation his head bleed takes top priority.  2. CVA - possible embolic, patient with evidence of hemorrhagic conversion - management per neuro - will arrange outpatient cardiac monitor.  - if TEE is recommended can arrange for Monday or as outpatient. Given the inability to anticoagulate outpatient may be reasonable as outpatient, as at this time TEE findings would not change management.        Dina Rich, M.D.

## 2016-05-13 NOTE — Progress Notes (Signed)
Physical Therapy Treatment Patient Details Name: Ronald Bruce MRN: [id number] DOB: 02/18/1936 Today's Date: 05/13/2016    History of Present Illness Ronald Bruce is an 81 y.o. 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  Work up in the ER included a head CT which showed a small intracebral hemorrhage, appeared not acute, along prior stroke.  He was found to have a troponin of 1.32.  EKG showed T wave inversion on precordial leads, but no acute changes.  He also has a K of 3.0, normal renal fx test, normal NH3, and a clear CXR, with fx rib.  Neurology was called, who felt that his intracranial blood is likely old.  Hospitalist was asked to admit him for further work up of intracranial bleed, elevated troponin, and failure to thrive.   Dx: NSTEMI, and CVA -possible embolic with evidence of hemorrhagic conversion.    PT Comments    Pt received curled up in fetal position in the bed, family member present who expresses that he hasn't been able to get Ronald Bruce to eat today.  Pt is very lethargic, and required Max A for supine<>sit transfer.  Pt demonstrates L UE spasticity - holding it flexed into his body.  Pt is able to recognize his L hand, but is unable to illicit any voluntary movement of the L UE upon request.  He required Mod A for sit<>stand, and was unable to straighten L elbow to place hand on the RW.  Upon standing pt became incontinent, and required assistance for a gown change.  Pt was able to ambulate 6140ft with RW and Mod A +2 person for safety.  However, this seems to be a change from the previous PT tx where he ambulated 14200ft with Min guard.  He is still recommended for SNF.     Follow Up Recommendations  SNF     Equipment Recommendations  None recommended by PT    Recommendations for Other Services        Precautions / Restrictions Precautions Precautions: Fall    Mobility  Bed Mobility Overal bed mobility: Needs Assistance Bed Mobility: Supine to Sit     Supine to sit: Max assist;HOB elevated        Transfers Overall transfer level: Needs assistance Equipment used: Rolling walker (2 wheeled) Transfers: Sit to/from Stand Sit to Stand: Mod assist;+2 physical assistance         General transfer comment: Pt is not able to straighten L elbow to place L hand on the RW.  Upon standing, pt became incontinent, and required gown change.    Ambulation/Gait Ambulation/Gait assistance: Mod assist;+2 physical assistance Ambulation Distance (Feet): 40 Feet Assistive device: Rolling walker (2 wheeled) Gait Pattern/deviations: Step-to pattern;Trunk flexed;Decreased stance time - left   Gait velocity interpretation: <1.8 ft/sec, indicative of risk for recurrent falls General Gait Details: Pt's L UE demonstrates increased spastisticity and he holds it into his body in flexed position.  Unable to get straight.  L LE is very rigid during gait with decreased L knee flexion for limb advancement.  Pt requires assistance for weight shifting, as well as assistance for forward progression.  Notable L sided neglect.     Stairs            Wheelchair Mobility    Modified Rankin (Stroke Patients Only)  Balance Overall balance assessment: History of Falls;Needs assistance Sitting-balance support: Single extremity supported;Feet supported Sitting balance-Leahy Scale: Poor     Standing balance support: Single extremity supported (Unable to get L UE straightened to hold onto RW. ) Standing balance-Leahy Scale: Zero                      Cognition Arousal/Alertness: Lethargic Behavior During Therapy: Agitated (Yelling out at times) Overall Cognitive Status: History of cognitive impairments - at baseline Area of Impairment: Orientation Orientation Level: Disoriented  to;Place;Time;Situation             General Comments: Caregiver present, and states he is normally able to use his L UE, but has not been able to get him to eat today.     Exercises General Exercises - Lower Extremity Long Arc Quad: Strengthening;Both;10 reps;Seated;Limitations Long [employer] Limitations: Unable to complete full range on the left, and needs extensive encouragement to perform task on the right.     General Comments        Pertinent Vitals/Pain Pain Assessment: No/denies pain    Home Living                      Prior Function            PT Goals (current goals can now be found in the care plan section) Acute Rehab PT Goals Patient Stated Goal: to get stronger PT Goal Formulation: With patient Time For Goal Achievement: 05/17/16 Potential to Achieve Goals: Fair Progress towards PT goals: Not progressing toward goals - comment    Frequency    Min 5X/week      PT Plan Current plan remains appropriate    Co-evaluation             End of Session Equipment Utilized During Treatment: Gait belt Activity Tolerance: Patient limited by fatigue Patient left: in bed;with bed alarm set;with family/visitor present     Time: 7829-5621 PT Time Calculation (min) (ACUTE ONLY): 32 min  Charges:  $Gait Training: 8-22 mins $Therapeutic Activity: 8-22 mins                    G Codes:  Functional Assessment Tool Used: [employer] "6-clicks"  Functional Limitation: Mobility: Walking and moving around Mobility: Walking and Moving Around Current Status [phone]): At least 60 percent but less than 80 percent impaired, limited or restricted Mobility: Walking and Moving Around Goal Status [phone]): At least 40 percent but less than 60 percent impaired, limited or restricted   Beth Damacio Weisgerber, PT, DPT X: [phone]

## 2016-05-13 NOTE — Progress Notes (Signed)
PROGRESS NOTE    Ronald Bruce  ZOX:[id number] DOB: February 09, 1936 DOA: 05/11/2016 PCP: [name], MD   Brief Narrative: 81 year old male with history of prior stroke with residual left-sided hemiparesis, presented with generalized weakness and frequent falls not eating much. In the ER CT scan of head showed a small intracranial hemorrhage. MRI of the brain showed numerous small areas of acute cerebral infarction throughout both cerebral hemispheres and cerebellum bilateral. Also with positive troponin. Neurologist and cardiologist evaluated the patient.  Assessment & Plan:   Principal Problem:   Intracranial bleeding (HCC) Active Problems:   Dehydration   Elevated troponin   Stroke (HCC)   Hypokalemia  #Acute cerebral infarction, multiple small areas of ischemia in bilateral cerebral hemispheres and cerebellum likely thrombo- embolic etiology. Also has acute hemorrhagic stroke at right parietal white matter: -MRI MRA : Multiple strokes and intracranial atherosclerotic disease. No large vessel occlusion noticed -Carotid ultrasound result reviewed. No significant stenosis. -PT, OT and social worker evaluation. Patient will be discharged to nursing facility for rehabilitation on discharge. Discussed with the social worker today.. -Currently on Lipitor 40 mg. Serum LDL level is 73 -Unable to give aspirin or anticoagulation because of intracranial hemorrhage. Discussed with the neurologist and cardiologist. -Follow up echocardiogram. -Discussed with the cardiologist, Dr. Wyline Mood today, he will arrange for outpatient cardiac monitoring and possibly TEE. -Continue diet. Provide supportive care.  #Elevated troponin level/NSTEMI: Peak troponin level of 1.4. Evaluated by cardiologist. Patient does not have any specific cardiopulmonary symptoms this time. He had generalized weakness and failure to thrive. EKG showed inferior and anterolateral Q waves. Unable to give aspirin or anticoagulation because of  intracranial hemorrhage. Follow-up echocardiogram.  #Hypertension: Monitor blood pressure. Currently on Norvasc, Avapro and Bystolic. Avoid sudden drop in blood pressure.  #Hypokalemia: Replete potassium chloride. Monitor labs.  DVT prophylaxis: SCD and early ambulation. Code Status: Full code Family Communication: Patient's brother at bedside Disposition Plan: Likely discharge to SNF in 1-2 days  Consultants:   Neurologist and cardiologist  Procedures: MRI and MRA brain, carotid ultrasound. Echo pending. Antimicrobials: None  Subjective: Patient was seen and examined at bedside. No new event. Denied headache, dizziness, chest pain, shortness of breath. Patient has poor oral intake.   Objective: Vitals:   05/12/16 2124 05/13/16 0124 05/13/16 0524 05/13/16 0949  BP: (!) 150/63 (!) 150/54 (!) 155/62 109/77  Pulse: 74 67 64 70  Resp: 18 18 20  (!) 21  Temp: 98.8 F (37.1 C) 98.9 F (37.2 C) 98.4 F (36.9 C) 98.6 F (37 C)  TempSrc: Oral Oral Axillary Axillary  SpO2: 97% 98% 97% 99%  Weight:      Height:        Intake/Output Summary (Last 24 hours) at 05/13/16 1411 Last data filed at 05/13/16 0900  Gross per 24 hour  Intake          2401.33 ml  Output              300 ml  Net          2101.33 ml   Filed Weights   05/11/16 1624 05/11/16 2332  Weight: 89.8 kg (198 lb) 70.2 kg (154 lb 11.2 oz)    Examination:  General exam: Appears calm and comfortable  Respiratory system: Clear bilaterally, no wheezing or crackle. Cardiovascular system: Regular rate rhythm, S1 and S2 normal. No pedal edema.  Gastrointestinal system: Abdomen is nondistended, soft and nontender. Normal bowel sounds heard. Central nervous system: Alert, awake and following commands. Extremities: Muscular  strength 5 over 5 in all extremities, slight weakness on the left side. Chronic in nature Skin: No rashes, lesions or ulcers Psychiatry: Judgement and insight appear normal. Mood & affect appropriate.      Data Reviewed: I have personally reviewed following labs and imaging studies  CBC:  Recent Labs Lab 05/11/16 1704  WBC 7.2  HGB 15.4  HCT 46.6  MCV 92.6  PLT 104*   Basic Metabolic Panel:  Recent Labs Lab 05/11/16 1704 05/12/16 0544 05/12/16 1120 05/13/16 0632  NA 142 142  --  144  K 3.0* 2.9* 3.2* 3.3*  CL 107 109  --  112*  CO2 26 26  --  25  GLUCOSE 167* 161*  --  181*  BUN 26* 21*  --  16  CREATININE 1.06 0.77  --  0.87  CALCIUM 9.2 8.6*  --  8.4*  MG  --   --   --  1.7   GFR: Estimated Creatinine Clearance: 69.5 mL/min (by C-G formula based on SCr of 0.87 mg/dL). Liver Function Tests:  Recent Labs Lab 05/11/16 1704  AST 22  ALT 8*  ALKPHOS 367*  BILITOT 0.7  PROT 7.0  ALBUMIN 3.6   No results for input(s): LIPASE, AMYLASE in the last 168 hours.  Recent Labs Lab 05/11/16 1927  AMMONIA 23   Coagulation Profile: No results for input(s): INR, PROTIME in the last 168 hours. Cardiac Enzymes:  Recent Labs Lab 05/11/16 1827 05/11/16 2349 05/12/16 0544 05/12/16 1120 05/13/16 0632  CKTOTAL 146  --   --   --   --   TROPONINI 1.32* 1.31* 1.20* 1.13* 1.44*   BNP (last 3 results) No results for input(s): PROBNP in the last 8760 hours. HbA1C: No results for input(s): HGBA1C in the last 72 hours. CBG:  Recent Labs Lab 05/11/16 1643  GLUCAP 156*   Lipid Profile:  Recent Labs  05/12/16 0541  CHOL 120  HDL 18*  LDLCALC 73  TRIG 161  CHOLHDL 6.7   Thyroid Function Tests:  Recent Labs  05/12/16 1645  TSH 1.510   Anemia Panel:  Recent Labs  05/12/16 1645  VITAMINB12 119*   Sepsis Labs: No results for input(s): PROCALCITON, LATICACIDVEN in the last 168 hours.  No results found for this or any previous visit (from the past 240 hour(s)).       Radiology Studies: Dg Ribs Unilateral W/chest Right  Result Date: 05/11/2016 CLINICAL DATA:  Fall with right lateral rib pain. Initial encounter. EXAM: RIGHT RIBS AND CHEST -  3+ VIEW COMPARISON:  None. FINDINGS: The heart size and mediastinal contours are normal. Lung volumes are low bilaterally. There is no evidence of pulmonary edema, consolidation, pneumothorax, nodule or pleural fluid. There is evidence of a relatively nondisplaced fracture involving the distal aspect of the right eleventh rib. No other injuries identified. No evidence of bony lesions. IMPRESSION: Nondisplaced fracture involving the distal right eleventh rib. Electronically Signed   By: Irish Lack M.D.   On: 05/11/2016 19:15   Ct Head Wo Contrast  Result Date: 05/11/2016 CLINICAL DATA:  Confusion and falls.  Previous stroke. EXAM: CT HEAD WITHOUT CONTRAST TECHNIQUE: Contiguous axial images were obtained from the base of the skull through the vertex without intravenous contrast. COMPARISON:  None available. FINDINGS: Brain: A 9 mm hyperdense focus is seen in the posterior right frontal lobe near the precentral gyrus, just above the sylvian fissure. No other acute hemorrhage is present. A remote right lentiform nucleus and white matter  infarct is present. Asymmetric white matter hypoattenuation is present on the right. There is moderate diffuse atrophy. The remote infarct involves the anterior right insular cortex. Remote lacunar infarcts are present in the cerebellum bilaterally. Calcifications are present along the tentorium. Vascular: Dense calcifications are present in the cavernous internal carotid arteries and at the dural margin of the vertebral arteries bilaterally. There is no hyperdense vessel. Skull: The calvarium is intact. An unerupted right maxillary molar is present. No focal lytic or blastic lesions are present. No significant extracranial soft tissue injury is present. Sinuses/Orbits: Mild mucosal thickening is present along the floor of the maxillary sinuses bilaterally. The remaining paranasal sinuses and the mastoid air cells are clear. There are no fluid levels. A left lens extraction is  noted. The globes and orbits are otherwise within normal limits. IMPRESSION: 1. 9 mm focal cortical/subcortical hemorrhage in the lateral aspect of the posterior right frontal lobe subjacent to the precentral gyrus. This likely reflects a small parenchymal hemorrhage. Underlying lesion is not excluded. 2. Remote infarct involving the right lentiform nucleus, anterior insular ribbon, and and superior white matter. 3. Moderate diffuse atrophy and white matter disease likely reflects the sequela of chronic microvascular ischemia. These results were called by telephone at the time of interpretation on 05/11/2016 at 7:22 pm to Dr. Loren Racer , who verbally acknowledged these results. Electronically Signed   By: Marin Roberts M.D.   On: 05/11/2016 19:22   Mr Maxine Glenn Head Wo Contrast  Result Date: 05/12/2016 CLINICAL DATA:  Stroke.  Hypertension EXAM: MRA HEAD WITHOUT CONTRAST TECHNIQUE: Angiographic images of the Circle of Willis were obtained using MRA technique without intravenous contrast. COMPARISON:  MRI head 05/12/2016 FINDINGS: Both vertebral arteries patent to the basilar without significant stenosis. PICA patent. Mild irregularity of the basilar without significant stenosis. Superior cerebellar arteries patent bilaterally. Irregularity and moderate stenosis in the mid right posterior cerebral artery. Moderate stenosis distal left posterior cerebral artery. Mild atherosclerotic irregularity and stenosis in the cavernous carotid bilaterally. Single A2 segment. Anterior cerebral arteries patent bilaterally. Middle cerebral artery patent bilaterally. Mild irregularity and stenosis and right M2 segment. Negative for cerebral aneurysm. IMPRESSION: Intracranial atherosclerotic disease mild to moderate in degree. No large vessel occlusion. Electronically Signed   By: Marlan Palau M.D.   On: 05/12/2016 08:29   Mr Brain Wo Contrast  Result Date: 05/12/2016 CLINICAL DATA:  Altered mental status. Confusion.  Stroke. Hypertension. EXAM: MRI HEAD WITHOUT CONTRAST TECHNIQUE: Multiplanar, multiecho pulse sequences of the brain and surrounding structures were obtained without intravenous contrast. COMPARISON:  CT head 05/11/2016 FINDINGS: Brain: Numerous small areas of acute infarct bilaterally. These are present throughout the frontal parietal cortex. The largest area is in the right parietal cortex. Small acute infarct in the pons. Multiple small areas of acute infarct in the cerebellum bilaterally. 6 x 10 mm hemorrhage in the right parietal white matter. This is high density on CT and appears acute and related to acute infarction. Other areas of chronic hemorrhage include the left temporal parietal lobe and right basal ganglia. Negative for mass or edema. Chronic microvascular ischemic change in the white matter, right greater than left Generalized moderate atrophy.  Negative for hydrocephalus. Vascular: Normal arterial flow voids. Skull and upper cervical spine: Negative Sinuses/Orbits: Mild mucosal edema in the paranasal sinuses. Left lens replacement. No orbital mass. Other: None IMPRESSION: Numerous small areas of acute cerebral infarction throughout both cerebral hemispheres and cerebellum bilaterally. Pattern most consistent with cerebral emboli. 1 cm area of  acute hemorrhage in the right parietal white matter as identified on CT. Electronically Signed   By: Marlan Palauharles  Clark M.D.   On: 05/12/2016 08:34   [place] Carotid Bilateral  Result Date: 05/12/2016 CLINICAL DATA:  CVA EXAM: BILATERAL CAROTID DUPLEX ULTRASOUND TECHNIQUE: Wallace CullensGray scale imaging, color Doppler and duplex ultrasound were performed of bilateral carotid and vertebral arteries in the neck. COMPARISON:  None. FINDINGS: Criteria: Quantification of carotid stenosis is based on velocity parameters that correlate the residual internal carotid diameter with NASCET-based stenosis levels, using the diameter of the distal internal carotid lumen as the denominator for  stenosis measurement. The following velocity measurements were obtained: RIGHT ICA:  42 cm/sec CCA:  64 cm/sec SYSTOLIC ICA/CCA RATIO:  0.7 DIASTOLIC ICA/CCA RATIO:  1.0 ECA:  96 cm/sec LEFT ICA:  38 cm/sec CCA:  79 cm/sec SYSTOLIC ICA/CCA RATIO:  0.5 DIASTOLIC ICA/CCA RATIO:  1.1 ECA:  50 cm/sec RIGHT CAROTID ARTERY: Mild calcified plaque in the bulb. Low resistance internal carotid Doppler pattern. RIGHT VERTEBRAL ARTERY:  Antegrade. LEFT CAROTID ARTERY: Mild calcified plaque in the bulb. Low resistance internal carotid Doppler pattern. LEFT VERTEBRAL ARTERY:  Antegrade. IMPRESSION: Less than 50% stenosis in the right and left internal carotid arteries. Mild calcified plaque is noted in both bulbs. Electronically Signed   By: Jolaine ClickArthur  Hoss M.D.   On: 05/12/2016 08:40        Scheduled Meds: .  stroke: mapping our early stages of recovery book   Does not apply Once  . amLODipine  10 mg Oral Daily   And  . irbesartan  300 mg Oral Daily  . atorvastatin  40 mg Oral q1800  . escitalopram  20 mg Oral Daily  . famotidine (PEPCID) IV  20 mg Intravenous Q12H  . nebivolol  10 mg Oral Daily  . sodium chloride flush  3 mL Intravenous Q12H  . tamsulosin  0.4 mg Oral Daily   Continuous Infusions: . dextrose 5 % and 0.9 % NaCl with KCl 20 mEq/L 100 mL/hr at 05/13/16 0109     LOS: 2 days    Kati Riggenbach Jaynie CollinsPrasad Radek Carnero, MD [facility] Pager [phone]  If 7PM-7AM, please contact night-coverage [email or web address] Password [email or web address] 05/13/2016, 2:11 PM

## 2016-05-13 NOTE — Progress Notes (Signed)
At 0525 unable to complete neuro checks, pt uncooperative and combative and attempting to get out of bed. MD paged and Ativan 1 mg IV ordered and given. Will continue to monitor.

## 2016-05-13 NOTE — Progress Notes (Signed)
*  PRELIMINARY RESULTS* Echocardiogram 2D Echocardiogram has been performed.  Stacey DrainWhite, Ketsia Linebaugh J 05/13/2016, 11:32 AM

## 2016-05-13 NOTE — Progress Notes (Signed)
Emily A. Merlene Laughter, MD     [email or web address]          Ronald Bruce is an 81 y.o. male.   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 The etiology is most likely due to acute MI with embolic phenomena or atrial fibrillation. Valve disease is also an issue. The patient is to be kept off antiplatelet agents or anticoagulation for the next month.  We are somewhat in a difficult situation in treating this patient. He does have throbocytopenia and has had multiple falls in the last 3 weeks. I think we will hold antiplatelet agents and anticoagulation as outlined above for 1 months. A 30 day event monitor is suggested. If he does have evidence of cardio embolic stroke either on echo or a 30 day event monitor, anticoagulation will be suggested if his fall risk improves. Otherwise antiplatelet agents is suggested.    Suspect the patient likely also has mild to moderate cognitive impairment at baseline. The patient most likely has vascular dementia. Dementia labs will be obtained.   Thrombocytopenia.  Vitamin B12 deficiency. Replacement will be initiated.   Hyperglycemia.       It appears that the patient was agitated overnight and given sedation about 6 AM. He is quite drowsy now. He does open eyes and tries to follow commands but closes his eyes after a few minutes. The patient's brother is he reports that he has had a left-sided weakness after his previous stroke and was able to am weight fairly well with infrequent falls onto the last 3 weeks. Over last 3 weeks his fell numerous times however. This seems to be commensurate with his imaging findings showing acute and chronic infarcts likely explaining his gait impairment.     GENERAL: Pleasant average weight male in no acute distress.  HEENT: Supple. Atraumatic normocephalic.   ABDOMEN: soft  EXTREMITIES: No edema   BACK: Normal.  SKIN: Normal by inspection.      MENTAL STATUS:  The patient is sleeping but arousable with light sternal rub and does follow commands intermittently.  CRANIAL NERVES: Pupils are equal, round and reactive to light and accommodation; extra ocular movements are full, there is no significant nystagmus; visual fields are full; upper and lower facial muscles are normal in strength and symmetric, there is no flattening of the nasolabial folds; tongue is midline; uvula is midline; shoulder elevation is normal.  MOTOR: Left upper extremity: Deltoid and triceps 4 minus/5 hand grip 5. Mild drift. Left lower extremity hip flexion 3/5 and dorsiflexion also 3/5. Marked drift. Right upper extremity normal tone and bulk and strength and no drift. Right lower extremity 4/5 and the marked drift.  COORDINATION: Left finger to nose is normal, right finger to nose is normal, No rest tremor; no intention tremor; no postural tremor; no bradykinesia.  REFLEXES: Deep tendon reflexes are symmetrical and normal. Babinski reflexes are flexor bilaterally.   SENSATION: Normal to light touch and pain. No extinction to double simultaneous stimulation but this was difficult as the patient seemed to have left right disorientation. No extinction to visual double simultaneous stimulation.    NIH stroke scale 2, 5, total 7.   The patient's brain MRI is reviewed in person. There are multiple small and moderate-sized acute lesion seen on DWI involving the cerebellum and cerebrum bilaterally. These are mostly deep white matter infarcts. 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 Evidence of a moderate size hemorrhages seen with reduced signal on SWI involving the  right posterior frontal parietal region. This area is also associated with increased signal on DWI indicating likely hemorrhagic transformation of an ischemic stroke.    CT is also reviewed and shows a tiny increased signal/hyperdensity involving the right frontal area  demonstrate with the findings on SWI an MRI scan. CT also shows marked leukoencephalopathy even more pronounced than on MRI.   Blood pressure (!) 155/62, pulse 64, temperature 98.4 F (36.9 C), temperature source Axillary, resp. rate 20, height 6' (1.829 m), weight 154 lb 11.2 oz (70.2 kg), SpO2 97 %.  Past Medical History:  Diagnosis Date  . Hypertension   . Stroke [facility])     History reviewed. No pertinent surgical history.  Family History  Problem Relation Age of Onset  . Hypertension Mother   . Pneumonia Mother   . Lung cancer Father     Social History:  reports that he has never smoked. He has never used smokeless tobacco. He reports that he drinks about 2.4 - 3.0 oz of alcohol per week . He reports that he does not use drugs.  Allergies: No Known Allergies  Medications: Prior to Admission medications   Medication Sig Start Date End Date Taking? Authorizing Provider  ALPRAZolam Duanne Moron) 1 MG tablet Take 1 mg by mouth 3 (three) times daily as needed for anxiety.   Yes Historical Provider, MD  amLODipine-valsartan (EXFORGE) 10-320 MG tablet Take 1 tablet by mouth daily.   Yes Historical Provider, MD  cholecalciferol (VITAMIN D) 1000 units tablet Take 1,000 Units by mouth daily.   Yes Historical Provider, MD  clopidogrel (PLAVIX) 75 MG tablet Take 75 mg by mouth daily. 05/01/16  Yes Historical Provider, MD  escitalopram (LEXAPRO) 20 MG tablet Take 20 mg by mouth daily.   Yes Historical Provider, MD  nebivolol (BYSTOLIC) 10 MG tablet Take 10 mg by mouth daily.   Yes Historical Provider, MD  tamsulosin (FLOMAX) 0.4 MG CAPS capsule Take 0.4 mg by mouth daily.   Yes Historical Provider, MD    Scheduled Meds: .  stroke: mapping our early stages of recovery book   Does not apply Once  . amLODipine  10 mg Oral Daily   And  . irbesartan  300 mg Oral Daily  . atorvastatin  40 mg Oral q1800  . escitalopram  20 mg Oral Daily  . famotidine (PEPCID) IV  20 mg Intravenous Q12H  .  nebivolol  10 mg Oral Daily  . sodium chloride flush  3 mL Intravenous Q12H  . tamsulosin  0.4 mg Oral Daily   Continuous Infusions: . dextrose 5 % and 0.9 % NaCl with KCl 20 mEq/L 100 mL/hr at 05/13/16 0109   PRN Meds:.ALPRAZolam     Results for orders placed or performed during the hospital encounter of 05/11/16 (from the past 48 hour(s))  CBG monitoring, ED     Status: Abnormal   Collection Time: 05/11/16  4:43 PM  Result Value Ref Range   Glucose-Capillary 156 (H) 65 - 99 mg/dL  Comprehensive metabolic panel     Status: Abnormal   Collection Time: 05/11/16  5:04 PM  Result Value Ref Range   Sodium 142 135 - 145 mmol/L   Potassium 3.0 (L) 3.5 - 5.1 mmol/L   Chloride 107 101 - 111 mmol/L   CO2 26 22 - 32 mmol/L   Glucose, Bld 167 (H) 65 - 99 mg/dL   BUN 26 (H) 6 - 20 mg/dL   Creatinine, Ser 1.06 0.61 - 1.24 mg/dL  Calcium 9.2 8.9 - 10.3 mg/dL   Total Protein 7.0 6.5 - 8.1 g/dL   Albumin 3.6 3.5 - 5.0 g/dL   AST 22 15 - 41 U/L   ALT 8 (L) 17 - 63 U/L   Alkaline Phosphatase 367 (H) 38 - 126 U/L   Total Bilirubin 0.7 0.3 - 1.2 mg/dL   GFR calc non Af Amer >60 >60 mL/min   GFR calc Af Amer >60 >60 mL/min    Comment: (NOTE) The eGFR has been calculated using the CKD EPI equation. This calculation has not been validated in all clinical situations. eGFR's persistently <60 mL/min signify possible Chronic Kidney Disease.    Anion gap 9 5 - 15  CBC     Status: Abnormal   Collection Time: 05/11/16  5:04 PM  Result Value Ref Range   WBC 7.2 4.0 - 10.5 K/uL   RBC 5.03 4.22 - 5.81 MIL/uL   Hemoglobin 15.4 13.0 - 17.0 g/dL   HCT 46.6 39.0 - 52.0 %   MCV 92.6 78.0 - 100.0 fL   MCH 30.6 26.0 - 34.0 pg   MCHC 33.0 30.0 - 36.0 g/dL   RDW 12.9 11.5 - 15.5 %   Platelets 104 (L) 150 - 400 K/uL    Comment: SPECIMEN CHECKED FOR CLOTS LARGE PLATELETS PRESENT GIANT PLATELETS SEEN PLATELET COUNT CONFIRMED BY SMEAR   Rapid urine drug screen (hospital performed)     Status: None    Collection Time: 05/11/16  6:19 PM  Result Value Ref Range   Opiates NONE DETECTED NONE DETECTED   Cocaine NONE DETECTED NONE DETECTED   Benzodiazepines NONE DETECTED NONE DETECTED   Amphetamines NONE DETECTED NONE DETECTED   Tetrahydrocannabinol NONE DETECTED NONE DETECTED   Barbiturates NONE DETECTED NONE DETECTED    Comment:        DRUG SCREEN FOR MEDICAL PURPOSES ONLY.  IF CONFIRMATION IS NEEDED FOR ANY PURPOSE, NOTIFY LAB WITHIN 5 DAYS.        LOWEST DETECTABLE LIMITS FOR URINE DRUG SCREEN Drug Class       Cutoff (ng/mL) Amphetamine      1000 Barbiturate      200 Benzodiazepine   258 Tricyclics       527 Opiates          300 Cocaine          300 THC              50   Urinalysis, Routine w reflex microscopic     Status: Abnormal   Collection Time: 05/11/16  6:20 PM  Result Value Ref Range   Color, Urine YELLOW YELLOW   APPearance CLEAR CLEAR   Specific Gravity, Urine 1.019 1.005 - 1.030   pH 5.0 5.0 - 8.0   Glucose, UA NEGATIVE NEGATIVE mg/dL   Hgb urine dipstick SMALL (A) NEGATIVE   Bilirubin Urine NEGATIVE NEGATIVE   Ketones, ur NEGATIVE NEGATIVE mg/dL   Protein, ur 100 (A) NEGATIVE mg/dL   Nitrite NEGATIVE NEGATIVE   Leukocytes, UA NEGATIVE NEGATIVE   RBC / HPF 0-5 0 - 5 RBC/hpf   WBC, UA 0-5 0 - 5 WBC/hpf   Bacteria, UA NONE SEEN NONE SEEN   Mucous PRESENT    Hyaline Casts, UA PRESENT   Ethanol     Status: None   Collection Time: 05/11/16  6:27 PM  Result Value Ref Range   Alcohol, Ethyl (B) <5 <5 mg/dL    Comment:  LOWEST DETECTABLE LIMIT FOR SERUM ALCOHOL IS 5 mg/dL FOR MEDICAL PURPOSES ONLY   CK     Status: None   Collection Time: 05/11/16  6:27 PM  Result Value Ref Range   Total CK 146 49 - 397 U/L  Troponin I     Status: Abnormal   Collection Time: 05/11/16  6:27 PM  Result Value Ref Range   Troponin I 1.32 (HH) <0.03 ng/mL    Comment: CRITICAL RESULT CALLED TO, READ BACK BY AND VERIFIED WITH: DOSS,M ON 05/11/16 AT 1900 BY LOY,C   TSH      Status: None   Collection Time: 05/11/16  6:27 PM  Result Value Ref Range   TSH 2.125 0.350 - 4.500 uIU/mL    Comment: Performed by a 3rd Generation assay with a functional sensitivity of <=0.01 uIU/mL.  Ammonia     Status: None   Collection Time: 05/11/16  7:27 PM  Result Value Ref Range   Ammonia 23 9 - 35 umol/L  Troponin I     Status: Abnormal   Collection Time: 05/11/16 11:49 PM  Result Value Ref Range   Troponin I 1.31 (HH) <0.03 ng/mL    Comment: CRITICAL VALUE NOTED.  VALUE IS CONSISTENT WITH PREVIOUSLY REPORTED AND CALLED VALUE.  Lipid panel     Status: Abnormal   Collection Time: 05/12/16  5:41 AM  Result Value Ref Range   Cholesterol 120 0 - 200 mg/dL   Triglycerides 145 <150 mg/dL   HDL 18 (L) >40 mg/dL   Total CHOL/HDL Ratio 6.7 RATIO   VLDL 29 0 - 40 mg/dL   LDL Cholesterol 73 0 - 99 mg/dL    Comment:        Total Cholesterol/HDL:CHD Risk Coronary Heart Disease Risk Table                     Men   Women  1/2 Average Risk   3.4   3.3  Average Risk       5.0   4.4  2 X Average Risk   9.6   7.1  3 X Average Risk  23.4   11.0        Use the calculated Patient Ratio above and the CHD Risk Table to determine the patient's CHD Risk.        ATP III CLASSIFICATION (LDL):  <100     mg/dL   Optimal  100-129  mg/dL   Near or Above                    Optimal  130-159  mg/dL   Borderline  160-189  mg/dL   High  >190     mg/dL   Very High   Troponin I     Status: Abnormal   Collection Time: 05/12/16  5:44 AM  Result Value Ref Range   Troponin I 1.20 (HH) <0.03 ng/mL    Comment: CRITICAL VALUE NOTED.  VALUE IS CONSISTENT WITH PREVIOUSLY REPORTED AND CALLED VALUE.  Basic metabolic panel     Status: Abnormal   Collection Time: 05/12/16  5:44 AM  Result Value Ref Range   Sodium 142 135 - 145 mmol/L   Potassium 2.9 (L) 3.5 - 5.1 mmol/L   Chloride 109 101 - 111 mmol/L   CO2 26 22 - 32 mmol/L   Glucose, Bld 161 (H) 65 - 99 mg/dL   BUN 21 (H) 6 - 20 mg/dL    Creatinine, Ser 0.77  0.61 - 1.24 mg/dL   Calcium 8.6 (L) 8.9 - 10.3 mg/dL   GFR calc non Af Amer >60 >60 mL/min   GFR calc Af Amer >60 >60 mL/min    Comment: (NOTE) The eGFR has been calculated using the CKD EPI equation. This calculation has not been validated in all clinical situations. eGFR's persistently <60 mL/min signify possible Chronic Kidney Disease.    Anion gap 7 5 - 15  Troponin I     Status: Abnormal   Collection Time: 05/12/16 11:20 AM  Result Value Ref Range   Troponin I 1.13 (HH) <0.03 ng/mL    Comment: CRITICAL VALUE NOTED.  VALUE IS CONSISTENT WITH PREVIOUSLY REPORTED AND CALLED VALUE.  Potassium     Status: Abnormal   Collection Time: 05/12/16 11:20 AM  Result Value Ref Range   Potassium 3.2 (L) 3.5 - 5.1 mmol/L  RPR     Status: None   Collection Time: 05/12/16  4:45 PM  Result Value Ref Range   RPR Ser Ql Non Reactive Non Reactive    Comment: (NOTE) Performed At: [facility] [place], [place] [id number] Lindon Romp MD DG:[id number]   Vitamin B12     Status: Abnormal   Collection Time: 05/12/16  4:45 PM  Result Value Ref Range   Vitamin B-12 119 (L) 180 - 914 pg/mL    Comment: (NOTE) This assay is not validated for testing neonatal or myeloproliferative syndrome specimens for Vitamin B12 levels. Performed at [facility]   TSH     Status: None   Collection Time: 05/12/16  4:45 PM  Result Value Ref Range   TSH 1.510 0.350 - 4.500 uIU/mL    Comment: Performed by a 3rd Generation assay with a functional sensitivity of <=0.01 uIU/mL.  Basic metabolic panel     Status: Abnormal   Collection Time: 05/13/16  6:32 AM  Result Value Ref Range   Sodium 144 135 - 145 mmol/L   Potassium 3.3 (L) 3.5 - 5.1 mmol/L   Chloride 112 (H) 101 - 111 mmol/L   CO2 25 22 - 32 mmol/L   Glucose, Bld 181 (H) 65 - 99 mg/dL   BUN 16 6 - 20 mg/dL   Creatinine, Ser 0.87 0.61 - 1.24 mg/dL   Calcium 8.4 (L) 8.9 - 10.3 mg/dL   GFR calc non Af  Amer >60 >60 mL/min   GFR calc Af Amer >60 >60 mL/min    Comment: (NOTE) The eGFR has been calculated using the CKD EPI equation. This calculation has not been validated in all clinical situations. eGFR's persistently <60 mL/min signify possible Chronic Kidney Disease.    Anion gap 7 5 - 15  Magnesium     Status: None   Collection Time: 05/13/16  6:32 AM  Result Value Ref Range   Magnesium 1.7 1.7 - 2.4 mg/dL  Troponin I     Status: Abnormal   Collection Time: 05/13/16  6:32 AM  Result Value Ref Range   Troponin I 1.44 (HH) <0.03 ng/mL    Comment: CRITICAL RESULT CALLED TO, READ BACK BY AND VERIFIED WITH: COVINGTON,L AT 7:30AM ON 05/13/16 BY FESTERMAN,C     Studies/Results:  BRAIN MRA FINDINGS: Both vertebral arteries patent to the basilar without significant stenosis. PICA patent. Mild irregularity of the basilar without significant stenosis. Superior cerebellar arteries patent bilaterally. Irregularity and moderate stenosis in the mid right posterior cerebral artery. Moderate stenosis distal left posterior cerebral artery.  Mild atherosclerotic irregularity and  stenosis in the cavernous carotid bilaterally. Single A2 segment. Anterior cerebral arteries patent bilaterally. Middle cerebral artery patent bilaterally. Mild irregularity and stenosis and right M2 segment.  Negative for cerebral aneurysm.  IMPRESSION: Intracranial atherosclerotic disease mild to moderate in degree. No large vessel occlusion.      BRAIN MRI FINDINGS: Brain: Numerous small areas of acute infarct bilaterally. These are present throughout the frontal parietal cortex. The largest area is in the right parietal cortex. Small acute infarct in the pons. Multiple small areas of acute infarct in the cerebellum bilaterally.  6 x 10 mm hemorrhage in the right parietal white matter. This is high density on CT and appears acute and related to acute infarction. Other areas of chronic  hemorrhage include the left temporal parietal lobe and right basal ganglia. Negative for mass or edema.  Chronic microvascular ischemic change in the white matter, right greater than left  Generalized moderate atrophy.  Negative for hydrocephalus.  Vascular: Normal arterial flow voids.  Skull and upper cervical spine: Negative  Sinuses/Orbits: Mild mucosal edema in the paranasal sinuses. Left lens replacement. No orbital mass.  Other: None  IMPRESSION: Numerous small areas of acute cerebral infarction throughout both cerebral hemispheres and cerebellum bilaterally. Pattern most consistent with cerebral emboli. 1 cm area of acute hemorrhage in the right parietal white matter as identified on CT.      HEAD CT 1. 9 mm focal cortical/subcortical hemorrhage in the lateral aspect of the posterior right frontal lobe subjacent to the precentral gyrus. This likely reflects a small parenchymal hemorrhage. Underlying lesion is not excluded. 2. Remote infarct involving the right lentiform nucleus, anterior insular ribbon, and and superior white matter. 3. Moderate diffuse atrophy and white matter disease likely reflects the sequela of chronic microvascular ischemia.         Lezlie Ritchey A. Merlene Laughter, M.D.  Diplomate, Tax adviser of Psychiatry and Neurology ( Neurology). 05/13/2016, 7:53 AM

## 2016-05-13 NOTE — NC FL2 (Signed)
[place] MEDICAID FL2 LEVEL OF CARE SCREENING TOOL     IDENTIFICATION  Patient Name: Ronald Bruce Birthdate: 09-Apr-1936 Sex: male Admission Date (Current Location): 05/11/2016  [facility] and [place] Number:  [employer] and Address:  [facility],  [address], [place] [address]      Provider Number: [id number]  Attending Physician Name and Address:  Maxie Barb, MD  Relative Name and Phone Number:       Current Level of Care:   Recommended Level of Care: Skilled Nursing Facility Prior Approval Number:    Date Approved/Denied:   PASRR Number: [id number] ([id number])  Discharge Plan: Home    Current Diagnoses: Patient Active Problem List   Diagnosis Date Noted  . Stroke (HCC)   . Hypokalemia   . Intracranial bleeding (HCC) 05/11/2016  . Dehydration 05/11/2016  . Elevated troponin 05/11/2016    Orientation RESPIRATION BLADDER Height & Weight     Self, Place  Normal Incontinent Weight: 154 lb 11.2 oz (70.2 kg) Height:  6' (182.9 cm)  BEHAVIORAL SYMPTOMS/MOOD NEUROLOGICAL BOWEL NUTRITION STATUS      Incontinent Diet (Heart Healthy)  AMBULATORY STATUS COMMUNICATION OF NEEDS Skin   Limited Assist Verbally Normal                       Personal Care Assistance Level of Assistance  Bathing, Feeding, Dressing Bathing Assistance: Limited assistance Feeding assistance: Independent Dressing Assistance: Limited assistance     Functional Limitations Info  Sight, Hearing, Speech Sight Info: Adequate Hearing Info: Adequate Speech Info: Adequate    SPECIAL CARE FACTORS FREQUENCY  PT (By licensed PT)     PT Frequency: 5x/week              Contractures Contractures Info: Present    Additional Factors Info  Code Status Code Status Info: Full code             Current Medications (05/13/2016):  This is the current hospital active medication list Current Facility-Administered Medications  Medication Dose Route  Frequency Provider Last Rate Last Dose  .  stroke: mapping our early stages of recovery book   Does not apply Once Dron Jaynie Collins, MD      . ALPRAZolam Prudy Feeler) tablet 1 mg  1 mg Oral TID PRN Houston Siren, MD      . amLODipine (NORVASC) tablet 10 mg  10 mg Oral Daily Houston Siren, MD   10 mg at 05/12/16 0908   And  . irbesartan (AVAPRO) tablet 300 mg  300 mg Oral Daily Houston Siren, MD   300 mg at 05/12/16 0908  . atorvastatin (LIPITOR) tablet 40 mg  40 mg Oral q1800 Houston Siren, MD   40 mg at 05/12/16 1725  . dextrose 5 % and 0.9 % NaCl with KCl 20 mEq/L infusion   Intravenous Continuous Dron Jaynie Collins, MD 100 mL/hr at 05/13/16 0109    . escitalopram (LEXAPRO) tablet 20 mg  20 mg Oral Daily Houston Siren, MD   20 mg at 05/12/16 1000  . famotidine (PEPCID) IVPB 20 mg premix  20 mg Intravenous Q12H Dron Jaynie Collins, MD   20 mg at 05/12/16 2239  . nebivolol (BYSTOLIC) tablet 10 mg  10 mg Oral Daily Houston Siren, MD   10 mg at 05/12/16 0908  . potassium chloride 10 mEq in 100 mL IVPB  10 mEq Intravenous Q1 Hr x 3 Dron Jaynie Collins, MD   10 mEq at 05/13/16  1159  . sodium chloride flush (NS) 0.9 % injection 3 mL  3 mL Intravenous Q12H Houston SirenPeter Le, MD   3 mL at 05/12/16 2239  . tamsulosin (FLOMAX) capsule 0.4 mg  0.4 mg Oral Daily Houston SirenPeter Le, MD   0.4 mg at 05/12/16 0908     Discharge Medications: Please see discharge summary for a list of discharge medications.  Relevant Imaging Results:  Relevant Lab Results:   Additional Information SSN [id number]  Lenor Provencher, Juleen ChinaHeather D, LCSW

## 2016-05-13 NOTE — Care Management Important Message (Signed)
Important Message  Patient Details  Name: Ronald Bruce MRN: [id number] Date of Birth: 07/05/1935   Medicare Important Message Given:  Yes    [name], RN 05/13/2016, 9:40 AM 

## 2016-05-13 NOTE — Progress Notes (Signed)
CRITICAL VALUE ALERT  Critical value received:  Troponin 1.44  Date of notification:  05/13/2016  Time of notification:  0737  Critical value read back:Yes.    Nurse who received alert:  Billie LadeValdese Jaislyn Blinn  MD notified (1st page):  Mr Ronalee BeltsBhandari  Time of first page:  [phone]  MD notified (2nd page):  Time of second page:  Responding MD:  Awaiting response  Time MD responded:  Awaiting response

## 2016-05-13 NOTE — Clinical Social Work Placement (Signed)
[facility] starting authorization. Five day LOG approved and [facility] agreeable. Discussed with pt's sister, [name] who states she is POA.   CLINICAL SOCIAL WORK PLACEMENT  NOTE  Date:  05/13/2016  Patient Details  Name: Ronald Bruce MRN: [id number] Date of Birth: 12/29/1935  Clinical Social Work is seeking post-discharge placement for this patient at the Skilled  Nursing Facility level of care (*CSW will initial, date and re-position this form in  chart as items are completed):  Yes   Patient/family provided with [facility] Clinical Social Work Department's list of facilities offering this level of care within the geographic area requested by the patient (or if unable, by the patient's family).      Patient/family informed of their freedom to choose among providers that offer the needed level of care, that participate in Medicare, Medicaid or managed care program needed by the patient, have an available bed and are willing to accept the patient.  Yes   Patient/family informed of [facility]'s ownership interest in [facility] and [facility], as well as of the fact that they are under no obligation to receive care at these facilities.  PASRR submitted to EDS on 05/13/16     PASRR number received on 05/13/16     Existing PASRR number confirmed on       FL2 transmitted to all facilities in geographic area requested by pt/family on 05/13/16     FL2 transmitted to all facilities within larger geographic area on       Patient informed that his/her managed care company has contracts with or will negotiate with certain facilities, including the following:        Yes   Patient/family informed of bed offers received.  [facility]     Physician recommends and patient chooses bed at      Patient to be transferred [facility] on  .  Patient to be transferred to facility by       Patient family notified on   of transfer.  Name of family  member notified:        PHYSICIAN       Additional Comment:    _______________________________________________ Karn CassisStultz, Aislynn Cifelli [place], LCSW 05/13/2016, 2:30 PM

## 2016-05-13 NOTE — Care Management Important Message (Deleted)
Important Message  Patient Details  Name: Ronald Bruce MRN: [id number] Date of Birth: 02/07/1936   Medicare Important Message Given:  Yes    Malcolm MetroChildress, Pedrohenrique [place] Demske, RN 05/13/2016, 9:40 AM

## 2016-05-14 LAB — BASIC METABOLIC PANEL
Anion gap: 4 — ABNORMAL LOW (ref 5–15)
BUN: 13 mg/dL (ref 6–20)
CO2: 25 mmol/L (ref 22–32)
CREATININE: 0.82 mg/dL (ref 0.61–1.24)
Calcium: 8.3 mg/dL — ABNORMAL LOW (ref 8.9–10.3)
Chloride: 114 mmol/L — ABNORMAL HIGH (ref 101–111)
GFR calc Af Amer: >60 mL/min (ref >60)
Glucose, Bld: 161 mg/dL — ABNORMAL HIGH (ref 65–99)
Potassium: 3.4 mmol/L — ABNORMAL LOW (ref 3.5–5.1)
SODIUM: 143 mmol/L (ref 135–145)

## 2016-05-14 LAB — MAGNESIUM: MAGNESIUM: 2 mg/dL (ref 1.7–2.4)

## 2016-05-14 MED ORDER — POTASSIUM CHLORIDE CRYS ER 20 MEQ PO TBCR
40.0000 meq | EXTENDED_RELEASE_TABLET | Freq: Every day | ORAL | Status: DC
  Administered 2016-05-14 – 2016-05-22 (×7): 40 meq via ORAL
  Filled 2016-05-14 (×9): qty 2

## 2016-05-14 NOTE — Clinical Social Work Note (Signed)
Clinical Social Work Assessment  Patient Details  Name: Ronald Bruce MRN: [id number] Date of Birth: 02/23/1936  Date of referral:  05/13/16               Reason for consult:  Discharge Planning                Permission sought to share information with:    Permission granted to share information::     Name::        Agency::     Relationship::     Contact Information:  Tressie EllisDonald Strum, brother, was at bedside.   Housing/Transportation Living arrangements for the past 2 months:  Single Family Home Source of Information:  Other (Comment Required) (Sibling, Tressie Ell[place]ald State) Patient Interpreter Needed:  None Criminal Activity/Legal Involvement Pertinent to Current Situation/Hospitalization:  No - Comment as needed Significant Relationships:  Adult Children, Siblings Lives with:  Other (Comment) (Patient has a caregiver, Karleen DolphinCherie Anderson, [phone], who lives with him and provieds assistance with ADLs. ) Do you feel safe going back to the place where you live?  Yes Need for family participation in patient care:  Yes (Comment)  Care giving concerns:  Patient has a 24/7 live in caregiver.   Social Worker assessment / plan:  Patient's brother, Tressie EllisDonald Bunt, that advised that he believed that rehab would be beneficial to him. At baseline, patient ambulates with a "walking stick" or a cane and ADLs are assisted by caregiver as needed. [name] stated that he was interested in rehab for patient in [place]. [facility] no beds were available in [place]. CSW confirmed with supervisor, Wandra MannanZack Brooks, that a 5 day LOG could be provided to facility while authorization was being persued.    Employment status:  Retired Database administratornsurance information:  Managed Medicare PT Recommendations:  Not assessed at this time Information / Referral to community resources:  Skilled Nursing Facility  Patient/Family's Response to care:  Family is agreeable for patient to go to short term  rehab.  Patient/Family's Understanding of and Emotional Response to Diagnosis, Current Treatment, and Prognosis: Family understands patient's diagnosis, treatment and prognosis.   Emotional Assessment Appearance:  Appears stated age Attitude/Demeanor/Rapport:   (Cooperative) Affect (typically observed):  Calm Orientation:  Oriented to Self, Oriented to Place Alcohol / Substance use:  Not Applicable Psych involvement (Current and /or in the community):  No (Comment)  Discharge Needs  Concerns to be addressed:  Discharge Planning Concerns Readmission within the last 30 days:  No Current discharge risk:  None Barriers to Discharge:  No Barriers Identified (Will need 5 day LOG while facility seeks insurance authorization.)   Annice NeedySettle, Perl Kerney D, LCSW 05/14/2016, 12:21 PM

## 2016-05-14 NOTE — Progress Notes (Signed)
Pt daughter called with concerns about the her father's condition. She said her uncle is in the room and thinks the pt is having a stoke because the pt keep repeating "hey". Adv daughter that I would complete my stroke assessment and discuss finding with her. No changes on pt's condition. He has been calling out "hey" since last night. He does obey simple commands. He will try to engage in a brief conversation. He declined feelings of pain. Vitals are stale. Will continue to monitor pt.

## 2016-05-14 NOTE — Evaluation (Signed)
Clinical/Bedside Swallow Evaluation Patient Details  Name: Ronald Bruce MRN: [id number] Date of Birth: 05/01/1936  Today's Date: 05/14/2016 Time: SLP Start Time (ACUTE ONLY): 1354 SLP Stop Time (ACUTE ONLY): 1421 SLP Time Calculation (min) (ACUTE ONLY): 27 min  Past Medical History:  Past Medical History:  Diagnosis Date  . Hypertension   . Stroke [facility](HCC)    Past Surgical History: History reviewed. No pertinent surgical history. HPI:  Pt is a 81 year old male with history of prior stroke with residual left-sided hemiparesis, presented with generalized weakness and frequent falls not eating much. In the ER CT scan of head showed a small intracranial hemorrhage. MRI of the brain showed numerous small areas of acute cerebral infarction throughout both cerebral hemispheres and cerebellum bilateral. Also with positive troponin. Neurologist and cardiologist evaluated the patient.   Assessment / Plan / Recommendation Clinical Impression  Pts swallow presents within functional limits. Oral motor exam unremarkable. Pt initially yelling out upon SLP entrance to room. Niece present, states since hospitalization pt with significant changes in mentation. RN reports pt biting down on utensils and with minimal PO intake. Pt however agreeable to PO trials with SLP. Oral cavity with xerostomia, unable to complete oral care due to pt hx of agitation. Pt consumed 360 cc of thin liquids via cup sip without overt signs or symptoms of aspiration. Pt cognitively unable to use straw with consistent wide opening mouth response. Pt with adequate dentition and displayed functional mastication of solid PO with guarded feeding assistance. Recommend continue regular thin liquid diet with medicines crushed in puree and full feeding assistance to maximize nutrition and hydration. Pt would benefit from cognitive linguistic evaluation as significant changes with mentation have been reported by family. SLP to follow up during acute  stay.        Aspiration Risk  Mild aspiration risk    Diet Recommendation     Medication Administration: Crushed with puree    Other  Recommendations Oral Care Recommendations: Oral care BID   Follow up Recommendations Skilled Nursing facility      Frequency and Duration min 1 x/week  1 week       Prognosis Prognosis for Safe Diet Advancement: Fair Barriers to Reach Goals: Time post onset;Severity of deficits      Swallow Study   General Date of Onset: 05/11/16 HPI: Pt is a 81 year old male with history of prior stroke with residual left-sided hemiparesis, presented with generalized weakness and frequent falls not eating much. In the ER CT scan of head showed a small intracranial hemorrhage. MRI of the brain showed numerous small areas of acute cerebral infarction throughout both cerebral hemispheres and cerebellum bilateral. Also with positive troponin. Neurologist and cardiologist evaluated the patient. Type of Study: Bedside Swallow Evaluation Diet Prior to this Study: Regular;Thin liquids Temperature Spikes Noted: No Respiratory Status: Room air History of Recent Intubation: No Behavior/Cognition: Alert;Confused;Cooperative Oral Cavity Assessment: Dry Oral Care Completed by SLP: Other (Comment) (refused oral care) Oral Cavity - Dentition: Adequate natural dentition Self-Feeding Abilities: Needs assist Patient Positioning: Upright in bed Baseline Vocal Quality: Normal Volitional Cough: Cognitively unable to elicit    Oral/Motor/Sensory Function Overall Oral Motor/Sensory Function: Within functional limits   Ice Chips Ice chips: Not tested   Thin Liquid Thin Liquid: Within functional limits    Nectar Thick Nectar Thick Liquid: Not tested   Honey Thick Honey Thick Liquid: Not tested   Puree Puree: Within functional limits   Solid   GO   Solid: Within functional limits  Marcene Duos MA, [name] Acute Care Speech Language Pathologist    Marcene Duos  E 05/14/2016,2:33 PM

## 2016-05-14 NOTE — Progress Notes (Signed)
PROGRESS NOTE    Ronald Bruce  WJX:[id number] DOB: 06-23-1935 DOA: 05/11/2016 PCP: [name], MD   Brief Narrative: 81 year old male with history of prior stroke with residual left-sided hemiparesis, presented with generalized weakness and frequent falls not eating much. In the ER CT scan of head showed a small intracranial hemorrhage. MRI of the brain showed numerous small areas of acute cerebral infarction throughout both cerebral hemispheres and cerebellum bilateral. Also with positive troponin. Neurologist and cardiologist evaluated the patient.  Assessment & Plan:   Principal Problem:   Intracranial bleeding (HCC) Active Problems:   Dehydration   Elevated troponin   Stroke (HCC)   Hypokalemia   FTT (failure to thrive) in adult  #Acute cerebral infarction, multiple small areas of ischemia in bilateral cerebral hemispheres and cerebellum likely thrombo- embolic etiology. Also has acute hemorrhagic stroke at right parietal white matter: -MRI MRA : Multiple strokes and intracranial atherosclerotic disease. No large vessel occlusion noticed -Carotid ultrasound result reviewed. No significant stenosis. -PT, OT and social worker evaluation. Patient will be discharged to nursing facility for rehabilitation on discharge.  -Currently on Lipitor 40 mg. Serum LDL level is 73 -Unable to give aspirin or anticoagulation because of intracranial hemorrhage. Discussed with the neurologist and cardiologist. -Echocardiogram with normal systolic function, grade 1 diastolic dysfunction. -Discussed with the cardiologist, Dr. Wyline Mood on 05/13/2016, he will arrange for outpatient cardiac monitoring and possibly TEE. -Continue diet. Provide supportive care.  #Elevated troponin level/NSTEMI: Peak troponin level of 1.4. Evaluated by cardiologist. Patient does not have any specific cardiopulmonary symptoms this time. He had generalized weakness and failure to thrive. EKG showed inferior and anterolateral Q waves.  Unable to give aspirin or anticoagulation because of intracranial hemorrhage. Echo showed no regional wall motion abnormalities.  #Hypertension: Blood pressure elevated this morning. Received  Norvasc, Avapro and Bystolic. Avoid sudden drop in blood pressure. Continue to monitor blood pressure.  #Hypokalemia: Replete potassium.  # Failure to thrive and possibly mild protein calorie malnutrition: Patient with reduced oral intake. Discussed with the patient's sister at bedside. Very concerned about not eating well. We will do formal swallow evaluation today. Encourage oral intake. Discussed with the patient's family members and nursing staff. Dietary evaluation.  DVT prophylaxis: SCD and early ambulation. Code Status: Full code Family Communication: Patient's sister at bedside Disposition Plan: Likely discharge to SNF in 1-2 days  Consultants:   Neurologist and cardiologist  Procedures: MRI and MRA brain, carotid ultrasound. Echo  Antimicrobials: None  Subjective: Patient was seen and examined at bedside. No new event. Patient is not eating much. He denied chest pain, shortness of breath, nausea or vomiting. No headache. Objective: Vitals:   05/13/16 2124 05/14/16 0124 05/14/16 0524 05/14/16 0924  BP: (!) 167/95 (!) 141/56 (!) 145/63 (!) 171/67  Pulse: 69 71 70 68  Resp: 20 19  18   Temp: 98 F (36.7 C) 99.2 F (37.3 C) 98.5 F (36.9 C) 99 F (37.2 C)  TempSrc: Axillary Axillary Axillary Oral  SpO2: 98% 96% 96% 97%  Weight:      Height:        Intake/Output Summary (Last 24 hours) at 05/14/16 1340 Last data filed at 05/14/16 0300  Gross per 24 hour  Intake          2478.33 ml  Output                0 ml  Net          2478.33 ml   [employer]  05/11/16 1624 05/11/16 2332  Weight: 89.8 kg (198 lb) 70.2 kg (154 lb 11.2 oz)    Examination:  General exam: Not in distress, lying in bed comfortable. Respiratory system: Clear bilaterally, no wheezing or  crackle. Cardiovascular system: Regular rate rhythm, S1 and S2 normal. No pedal edema.  Gastrointestinal system: Abdomen is nondistended, soft and nontender. Normal bowel sounds heard. Central nervous system: Alert, awake and following commands. Extremities: Muscular strength 5 over 5 in all extremities, slight weakness on the left side. Chronic in nature Skin: No rashes, lesions or ulcers Psychiatry: Judgement and insight appear normal. Mood & affect appropriate.     Data Reviewed: I have personally reviewed following labs and imaging studies  CBC:  Recent Labs Lab 05/11/16 1704  WBC 7.2  HGB 15.4  HCT 46.6  MCV 92.6  PLT 104*   Basic Metabolic Panel:  Recent Labs Lab 05/11/16 1704 05/12/16 0544 05/12/16 1120 05/13/16 0632 05/14/16 0700  NA 142 142  --  144 143  K 3.0* 2.9* 3.2* 3.3* 3.4*  CL 107 109  --  112* 114*  CO2 26 26  --  25 25  GLUCOSE 167* 161*  --  181* 161*  BUN 26* 21*  --  16 13  CREATININE 1.06 0.77  --  0.87 0.82  CALCIUM 9.2 8.6*  --  8.4* 8.3*  MG  --   --   --  1.7 2.0   GFR: Estimated Creatinine Clearance: 73.7 mL/min (by C-G formula based on SCr of 0.82 mg/dL). Liver Function Tests:  Recent Labs Lab 05/11/16 1704  AST 22  ALT 8*  ALKPHOS 367*  BILITOT 0.7  PROT 7.0  ALBUMIN 3.6   No results for input(s): LIPASE, AMYLASE in the last 168 hours.  Recent Labs Lab 05/11/16 1927  AMMONIA 23   Coagulation Profile: No results for input(s): INR, PROTIME in the last 168 hours. Cardiac Enzymes:  Recent Labs Lab 05/11/16 1827 05/11/16 2349 05/12/16 0544 05/12/16 1120 05/13/16 0632  CKTOTAL 146  --   --   --   --   TROPONINI 1.32* 1.31* 1.20* 1.13* 1.44*   BNP (last 3 results) No results for input(s): PROBNP in the last 8760 hours. HbA1C: No results for input(s): HGBA1C in the last 72 hours. CBG:  Recent Labs Lab 05/11/16 1643  GLUCAP 156*   Lipid Profile:  Recent Labs  05/12/16 0541  CHOL 120  HDL 18*  LDLCALC  73  TRIG 145  CHOLHDL 6.7   Thyroid Function Tests:  Recent Labs  05/12/16 1645  TSH 1.510   Anemia Panel:  Recent Labs  05/12/16 1645  VITAMINB12 119*   Sepsis Labs: No results for input(s): PROCALCITON, LATICACIDVEN in the last 168 hours.  No results found for this or any previous visit (from the past 240 hour(s)).       Radiology Studies: No results found.      Scheduled Meds: .  stroke: mapping our early stages of recovery book   Does not apply Once  . amLODipine  10 mg Oral Daily   And  . irbesartan  300 mg Oral Daily  . atorvastatin  40 mg Oral q1800  . escitalopram  20 mg Oral Daily  . famotidine (PEPCID) IV  20 mg Intravenous Q12H  . nebivolol  10 mg Oral Daily  . sodium chloride flush  3 mL Intravenous Q12H  . tamsulosin  0.4 mg Oral Daily   Continuous Infusions: . dextrose 5 % and 0.9 %  NaCl with KCl 20 mEq/L 100 mL/hr at 05/14/16 0554     LOS: 3 days    Dron Jaynie CollinsPrasad Bhandari, MD [facility] Pager [phone]  If 7PM-7AM, please contact night-coverage [email or web address] Password [email or web address] 05/14/2016, 1:40 PM

## 2016-05-15 ENCOUNTER — Inpatient Hospital Stay (HOSPITAL_COMMUNITY): Payer: Medicare HMO

## 2016-05-15 MED ORDER — HYDRALAZINE HCL 10 MG PO TABS
10.0000 mg | ORAL_TABLET | Freq: Three times a day (TID) | ORAL | Status: DC
  Administered 2016-05-15 – 2016-05-17 (×4): 10 mg via ORAL
  Filled 2016-05-15 (×5): qty 1

## 2016-05-15 MED ORDER — HYDROCODONE-ACETAMINOPHEN 5-325 MG PO TABS
1.0000 | ORAL_TABLET | Freq: Four times a day (QID) | ORAL | Status: DC | PRN
  Administered 2016-05-15 – 2016-05-16 (×2): 1 via ORAL
  Filled 2016-05-15 (×2): qty 1

## 2016-05-15 NOTE — Progress Notes (Signed)
Rec'd 2 calls from daughter and 3 calls from grand-daughter throughout the night. They are both very concerned about the pt's condition. Adv many times that pt is stable. No new findings on stroke assessment. Family thinks pt is declining. They told me they did not feel comfortable sending the pt to a nursing home just yet. They would like to see his health improve. Adv they would have to discuss concerns with the doctor.

## 2016-05-15 NOTE — Progress Notes (Addendum)
PROGRESS NOTE    Ronald Bruce  ZOX:[id number] DOB: 08/02/1935 DOA: 05/11/2016 PCP: Dwana MelenaZack Hall, MD   Brief Narrative: 81 year old male with history of prior stroke with residual left-sided hemiparesis, presented with generalized weakness and frequent falls not eating much. In the ER CT scan of head showed a small intracranial hemorrhage. MRI of the brain showed numerous small areas of acute cerebral infarction throughout both cerebral hemispheres and cerebellum bilateral. Also with positive troponin. Neurologist and cardiologist evaluated the patient.  Assessment & Plan:   Principal Problem:   Intracranial bleeding (HCC) Active Problems:   Dehydration   Elevated troponin   Stroke (HCC)   Hypokalemia   FTT (failure to thrive) in adult  # Acute cerebral infarction, multiple small areas of ischemia in bilateral cerebral hemispheres and cerebellum likely thrombo- embolic etiology. Also has acute hemorrhagic stroke at right parietal white matter: -MRI MRA : Multiple strokes and intracranial atherosclerotic disease. No large vessel occlusion noticed -Carotid ultrasound result reviewed. No significant stenosis. -PT, OT and social worker evaluation. Patient will be discharged to nursing facility for rehabilitation on discharge.  -Currently on Lipitor 40 mg. Serum LDL level is 73 -Unable to give aspirin or anticoagulation because of intracranial hemorrhage. Discussed with the neurologist and cardiologist. -Echocardiogram with normal systolic function, grade 1 diastolic dysfunction. -Discussed with the cardiologist, Dr. Wyline MoodBranch on 05/13/2016, he will arrange for outpatient cardiac monitoring and possibly TEE. -Continue diet. Provide supportive care. -Patient with cognition decline and intermittent agitation likely due to acute stroke . No change in muscle strength. Treatment options limited because of intracranial hemorrhages. I discussed with the patient's sister at bedside in detail. Patient is still  with poor oral intake requiring assistance. We will treat for his agitation. Cognition evaluation and treatment by speech team. -patient also with aphasia. I will get stat CT head today.   #Elevated troponin level/NSTEMI: Peak troponin level of 1.4. Evaluated by cardiologist. Patient does not have any specific cardiopulmonary symptoms this time. He had generalized weakness and failure to thrive. EKG showed inferior and anterolateral Q waves. Unable to give aspirin or anticoagulation because of intracranial hemorrhage. Echo showed no regional wall motion abnormalities.  #Hypertension: Blood pressure elevated.  Norvasc, Avapro and Bystolic. I will add low dose hydralazine. Monitor blood pressure.  #Hypokalemia: Monitor labs.  # Failure to thrive and possibly mild protein calorie malnutrition: Patient with cognition impairment and not eating well. Sister is very concerned at bedside. He is eating minimal with health. Swallow evaluation ongoing. He does not have dysphagia. Discussed with the patient's sister at length regarding the nature of the stroke and limited treatment option because of hemorrhage. Also discussed about the goals of care and they're willing to discuss with the palliative care team. I will consult palliative care.  DVT prophylaxis: SCD . Code Status: Full code Family Communication: Patient's sister at bedside Disposition Plan: Likely discharge to SNF in 1-2 days  Consultants:   Neurologist and cardiologist  Procedures: MRI and MRA brain, carotid ultrasound. Echo  Antimicrobials: None  Subjective: Patient was seen and examined at bedside. Patient is alert awake. Denied headache, dizziness, nausea or vomiting. Review of system unreliable because of his stroke.  Objective: Vitals:   05/14/16 1645 05/14/16 2124 05/15/16 0524 05/15/16 0924  BP: (!) 168/78 (!) 166/74 (!) 169/79 (!) 171/78  Pulse: 75 77 71 76  Resp: 18 20 18 18   Temp: 98.3 F (36.8 C) 98 F (36.7 C) 97.5 F  (36.4 C) 97.7 F (36.5 C)  TempSrc: Oral Axillary Axillary Oral  SpO2: 98% 97% 96% 98%  Weight:      Height:        Intake/Output Summary (Last 24 hours) at 05/15/16 1204 Last data filed at 05/15/16 0553  Gross per 24 hour  Intake                0 ml  Output             1900 ml  Net            -1900 ml   Filed Weights   05/11/16 1624 05/11/16 2332  Weight: 89.8 kg (198 lb) 70.2 kg (154 lb 11.2 oz)    Examination:  General exam: Not in distress, lying on bed comfortable. Respiratory system: Clear bilaterally, no wheezing or crackle. Cardiovascular system: Regular rate rhythm, S1 and S2 normal. No pedal edema.  Gastrointestinal system: Abdomen is nondistended, soft and nontender. Normal bowel sounds heard. Central nervous system: Alert awake and following minimal commands. Probably has aphasia. Extremities: Muscular strength 5 over 5 in all extremities, weakness on the left side.  Skin: No rashes, lesions or ulcers Psychiatry: Judgement and insight appear impaired. Mood & affect flat     Data Reviewed: I have personally reviewed following labs and imaging studies  CBC:  Recent Labs Lab 05/11/16 1704  WBC 7.2  HGB 15.4  HCT 46.6  MCV 92.6  PLT 104*   Basic Metabolic Panel:  Recent Labs Lab 05/11/16 1704 05/12/16 0544 05/12/16 1120 05/13/16 0632 05/14/16 0700  NA 142 142  --  144 143  K 3.0* 2.9* 3.2* 3.3* 3.4*  CL 107 109  --  112* 114*  CO2 26 26  --  25 25  GLUCOSE 167* 161*  --  181* 161*  BUN 26* 21*  --  16 13  CREATININE 1.06 0.77  --  0.87 0.82  CALCIUM 9.2 8.6*  --  8.4* 8.3*  MG  --   --   --  1.7 2.0   GFR: Estimated Creatinine Clearance: 73.7 mL/min (by C-G formula based on SCr of 0.82 mg/dL). Liver Function Tests:  Recent Labs Lab 05/11/16 1704  AST 22  ALT 8*  ALKPHOS 367*  BILITOT 0.7  PROT 7.0  ALBUMIN 3.6   No results for input(s): LIPASE, AMYLASE in the last 168 hours.  Recent Labs Lab 05/11/16 1927  AMMONIA 23    Coagulation Profile: No results for input(s): INR, PROTIME in the last 168 hours. Cardiac Enzymes:  Recent Labs Lab 05/11/16 1827 05/11/16 2349 05/12/16 0544 05/12/16 1120 05/13/16 0632  CKTOTAL 146  --   --   --   --   TROPONINI 1.32* 1.31* 1.20* 1.13* 1.44*   BNP (last 3 results) No results for input(s): PROBNP in the last 8760 hours. HbA1C: No results for input(s): HGBA1C in the last 72 hours. CBG:  Recent Labs Lab 05/11/16 1643  GLUCAP 156*   Lipid Profile: No results for input(s): CHOL, HDL, LDLCALC, TRIG, CHOLHDL, LDLDIRECT in the last 72 hours. Thyroid Function Tests:  Recent Labs  05/12/16 1645  TSH 1.510   Anemia Panel:  Recent Labs  05/12/16 1645  VITAMINB12 119*   Sepsis Labs: No results for input(s): PROCALCITON, LATICACIDVEN in the last 168 hours.  No results found for this or any previous visit (from the past 240 hour(s)).       Radiology Studies: No results found.      Scheduled Meds: .  stroke: mapping  our early stages of recovery book   Does not apply Once  . amLODipine  10 mg Oral Daily   And  . irbesartan  300 mg Oral Daily  . atorvastatin  40 mg Oral q1800  . escitalopram  20 mg Oral Daily  . famotidine (PEPCID) IV  20 mg Intravenous Q12H  . nebivolol  10 mg Oral Daily  . potassium chloride  40 mEq Oral Daily  . sodium chloride flush  3 mL Intravenous Q12H  . tamsulosin  0.4 mg Oral Daily   Continuous Infusions: . dextrose 5 % and 0.9 % NaCl with KCl 20 mEq/L 100 mL/hr at 05/15/16 0638     LOS: 4 days    Americus Scheurich Jaynie Collins, MD [facility] Pager [phone]  If 7PM-7AM, please contact night-coverage [email or web address] Password [email or web address] 05/15/2016, 12:04 PM

## 2016-05-15 NOTE — Progress Notes (Signed)
Unable to complete neuro check after multiple attempts. Pt open eyes to name being called but doesn't respond verbally to any questions. Pt not following simple commands at this time, only lifts right arm and yells out "hey". Brother at bedside with pt and says the pt has been sleeping all day. Vital signs are stable will continue to assess and monitor.

## 2016-05-15 NOTE — Progress Notes (Signed)
Family refused neuro checks and VS at 0924 and 1324 because pt. Was sleeping.

## 2016-05-16 ENCOUNTER — Encounter (HOSPITAL_COMMUNITY): Payer: Self-pay | Admitting: Primary Care

## 2016-05-16 DIAGNOSIS — Z7189 Other specified counseling: Secondary | ICD-10-CM

## 2016-05-16 DIAGNOSIS — Z515 Encounter for palliative care: Secondary | ICD-10-CM

## 2016-05-16 DIAGNOSIS — E43 Unspecified severe protein-calorie malnutrition: Secondary | ICD-10-CM

## 2016-05-16 LAB — CBC
HEMATOCRIT: 42.6 % (ref 39.0–52.0)
HEMOGLOBIN: 13.8 g/dL (ref 13.0–17.0)
MCH: 29.6 pg (ref 26.0–34.0)
MCHC: 32.4 g/dL (ref 30.0–36.0)
MCV: 91.4 fL (ref 78.0–100.0)
Platelets: 51 10*3/uL — ABNORMAL LOW (ref 150–400)
RBC: 4.66 MIL/uL (ref 4.22–5.81)
RDW: 13.1 % (ref 11.5–15.5)
WBC: 7.2 10*3/uL (ref 4.0–10.5)

## 2016-05-16 LAB — URINALYSIS, ROUTINE W REFLEX MICROSCOPIC
Bilirubin Urine: NEGATIVE
GLUCOSE, UA: 50 mg/dL — AB
Ketones, ur: NEGATIVE mg/dL
Leukocytes, UA: NEGATIVE
Nitrite: POSITIVE — AB
PH: 6 (ref 5.0–8.0)
Protein, ur: >=300 mg/dL — AB
Specific Gravity, Urine: 1.018 (ref 1.005–1.030)

## 2016-05-16 LAB — BASIC METABOLIC PANEL
Anion gap: 6 (ref 5–15)
BUN: 17 mg/dL (ref 6–20)
CHLORIDE: 115 mmol/L — AB (ref 101–111)
CO2: 24 mmol/L (ref 22–32)
CREATININE: 1 mg/dL (ref 0.61–1.24)
Calcium: 8.1 mg/dL — ABNORMAL LOW (ref 8.9–10.3)
GFR calc Af Amer: >60 mL/min (ref >60)
GFR calc non Af Amer: >60 mL/min (ref >60)
GLUCOSE: 178 mg/dL — AB (ref 65–99)
Potassium: 3.4 mmol/L — ABNORMAL LOW (ref 3.5–5.1)
Sodium: 145 mmol/L (ref 135–145)

## 2016-05-16 MED ORDER — ENSURE ENLIVE PO LIQD
237.0000 mL | Freq: Two times a day (BID) | ORAL | Status: DC
  Administered 2016-05-19 – 2016-05-22 (×7): 237 mL via ORAL

## 2016-05-16 MED ORDER — QUETIAPINE FUMARATE 25 MG PO TABS
50.0000 mg | ORAL_TABLET | Freq: Every day | ORAL | Status: DC
  Administered 2016-05-17 – 2016-05-21 (×4): 50 mg via ORAL
  Filled 2016-05-16 (×4): qty 2

## 2016-05-16 MED ORDER — FAMOTIDINE IN NACL 20-0.9 MG/50ML-% IV SOLN
20.0000 mg | Freq: Two times a day (BID) | INTRAVENOUS | Status: DC
  Administered 2016-05-16: 20 mg via INTRAVENOUS
  Filled 2016-05-16: qty 50

## 2016-05-16 MED ORDER — LORAZEPAM 2 MG/ML IJ SOLN: 1.0000 mg | Freq: Four times a day (QID) | INTRAMUSCULAR | Status: DC | PRN

## 2016-05-16 MED ORDER — ADULT MULTIVITAMIN W/MINERALS CH
1.0000 | ORAL_TABLET | Freq: Every day | ORAL | Status: DC
  Administered 2016-05-17 – 2016-05-22 (×5): 1 via ORAL
  Filled 2016-05-16 (×7): qty 1

## 2016-05-16 MED ORDER — FAMOTIDINE 20 MG PO TABS
20.0000 mg | ORAL_TABLET | Freq: Two times a day (BID) | ORAL | Status: DC
  Filled 2016-05-16: qty 1

## 2016-05-16 MED ORDER — DOCUSATE SODIUM 50 MG/5ML PO LIQD
200.0000 mg | Freq: Two times a day (BID) | ORAL | Status: DC
  Administered 2016-05-17 – 2016-05-21 (×8): 200 mg via ORAL
  Filled 2016-05-16 (×15): qty 20

## 2016-05-16 MED ORDER — DEXTROSE 5 % IV SOLN
1.0000 g | INTRAVENOUS | Status: DC
  Administered 2016-05-16 – 2016-05-20 (×5): 1 g via INTRAVENOUS
  Filled 2016-05-16 (×7): qty 10

## 2016-05-16 MED ORDER — HYDRALAZINE HCL 20 MG/ML IJ SOLN
10.0000 mg | Freq: Four times a day (QID) | INTRAMUSCULAR | Status: DC | PRN
Start: 2016-05-16 — End: 2016-05-17

## 2016-05-16 MED ORDER — HALOPERIDOL LACTATE 5 MG/ML IJ SOLN
1.0000 mg | Freq: Four times a day (QID) | INTRAMUSCULAR | Status: DC | PRN
Start: 2016-05-16 — End: 2016-05-21
  Administered 2016-05-17 – 2016-05-21 (×3): 1 mg via INTRAMUSCULAR
  Filled 2016-05-16 (×3): qty 1

## 2016-05-16 MED ORDER — KCL IN DEXTROSE-NACL 20-5-0.9 MEQ/L-%-% IV SOLN
INTRAVENOUS | Status: DC
  Administered 2016-05-16 – 2016-05-17 (×2): via INTRAVENOUS

## 2016-05-16 MED ORDER — CYANOCOBALAMIN 1000 MCG/ML IJ SOLN: 1000.0000 ug | INTRAMUSCULAR | Status: AC

## 2016-05-16 MED ORDER — HALOPERIDOL LACTATE 5 MG/ML IJ SOLN
2.0000 mg | Freq: Once | INTRAMUSCULAR | Status: AC
  Administered 2016-05-16: 2 mg via INTRAMUSCULAR
  Filled 2016-05-16: qty 1

## 2016-05-16 NOTE — Progress Notes (Signed)
Speech Language Pathology Treatment: Dysphagia  Patient Details Name: Ronald Bruce MRN: [id number] DOB: 12/16/1935 Today's Date: 05/16/2016 Time: 1200-1230 SLP Time Calculation (min) (ACUTE ONLY): 30 min  Assessment / Plan / Recommendation Clinical Impression  SLP came to room to complete SLE, however family immediately reported concerns regarding a dramatic change in Pt since Saturday when he was evaluated for BSE. Family reports that Pt is not eating and attempts at feed yield Pt biting and breaking plastic spoons. Pt verbalizes yes/no appropriately, however he denies pain and he appears to be in pain and agitated. Pt with coated teeth, tongue, and gums and oral care completed. Pt attempted to bite down on toothettes, toothbrush, and wash cloth so oral care required great care. SLP attempted po trials (ice chips, popsicle, puree, ice cream, tea), however pt does not attempt to close lips around bolus. He did masticate ice chips, but no swallow elicited. Pt grabbing SLP by arm and squeezing/pulling as if in frustration/pain. Pt is inappropriate for po at this time. Recommend NPO with oral care and ice chips/water if appropriate (ie. Pt asking). Pt is drastically different from performance on Saturday. See separate note for SLE. Recommend SNF for dysphagia and cognitive linguistic intervention.    HPI HPI: Pt is a 81 year old male with history of prior stroke with residual left-sided hemiparesis, presented with generalized weakness and frequent falls not eating much. In the ER CT scan of head showed a small intracranial hemorrhage. MRI of the brain showed numerous small areas of acute cerebral infarction throughout both cerebral hemispheres and cerebellum bilateral. Also with positive troponin. Neurologist and cardiologist evaluated the patient.      SLP Plan  Continue with current plan of care     Recommendations  Diet recommendations: NPO Medication Administration: Via alternative means                 Oral Care Recommendations: Oral care QID;Oral care prior to ice chip/H20;Staff/trained caregiver to provide oral care Follow up Recommendations: Skilled Nursing facility Plan: Continue with current plan of care       Thank you,  Ronald MorosDabney Bruce, [name] [phone]                 Bruce,Ronald 05/16/2016, 2:44 PM

## 2016-05-16 NOTE — Progress Notes (Signed)
Ronald Ford A. Merlene Laughter, MD     [email or web address]          Ronald Bruce is an 81 y.o. male.   ASSESSMENT/PLAN:         UPDATE The patient has been quite confused and agitated over the weekend. He has been pulling at things and hitting after staff. The family has been concerned. Extensive discussions were done with the family. He has been started on Pepcid as he only new medications. Repeat imaging shows essentially no significant changes. Repeat labs are also unrevealing. There reports that he has not had significant cognitive impairment dementia before that he did have some short-term memory impairment at baseline. I will discontinue the Pepcid. EEG will be obtained. Baseline oximetry on room air will also be done. Because of the agitation Circle will be added. If the above workup is unrevealing, repeat MRI will be done although he will most likely need to be sedated.      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 The etiology is most likely due to acute MI with embolic phenomena or atrial fibrillation. Valve disease is also an issue. The patient is to be kept off antiplatelet agents or anticoagulation for the next month.  We are somewhat in a difficult situation in treating this patient. He does have throbocytopenia and has had multiple falls in the last 3 weeks. I think we will hold antiplatelet agents and anticoagulation as outlined above for 1 months. A 30 day event monitor is suggested. If he does have evidence of cardio embolic stroke either on echo or a 30 day event monitor, anticoagulation will be suggested if his fall risk improves. Otherwise antiplatelet agents is suggested.    Suspect the patient likely also has mild to moderate cognitive impairment at baseline. The patient most likely has vascular dementia. Dementia labs will be obtained.   Thrombocytopenia.  Vitamin B12 deficiency. Replacement will be  initiated.   Hyperglycemia.       It appears that the patient was agitated overnight and given sedation about 6 AM. He is quite drowsy now. He does open eyes and tries to follow commands but closes his eyes after a few minutes. The patient's brother is he reports that he has had a left-sided weakness after his previous stroke and was able to am weight fairly well with infrequent falls onto the last 3 weeks. Over last 3 weeks his fell numerous times however. This seems to be commensurate with his imaging findings showing acute and chronic infarcts likely explaining his gait impairment.     GENERAL: Pleasant average weight male in no acute distress.  HEENT: Supple. Atraumatic normocephalic.   ABDOMEN: soft  EXTREMITIES: No edema   BACK: Normal.  SKIN: Normal by inspection.    MENTAL STATUS:  The patient is sleeping but arousable with light sternal rub and does not follow commands intermittently. He repeatedly say ZS.  CRANIAL NERVES: Pupils are - right pupil 5 mm and left 6 both are reactive; extra ocular movements are full, there is no significant nystagmus; visual fields are full; upper and lower facial muscles are normal in strength and symmetric, there is no flattening of the nasolabial folds; tongue is midline; uvula is midline; shoulder elevation is normal.  MOTOR: He moves both sides well.  COORDINATION: Left finger to nose is normal, right finger to nose is normal, No rest tremor; no intention tremor; no postural tremor; no bradykinesia.  REFLEXES: Deep tendon reflexes are symmetrical and normal.   SENSATION:  He responds to painful stem light bilaterally.    NIH stroke scale 2, 5, total 7.   The patient's brain MRI is reviewed in person. There are multiple small and moderate-sized acute lesion seen on DWI involving the cerebellum and cerebrum bilaterally. These are mostly deep white matter infarcts. There is moderate periventricular leukoencephalopathy seen on  FLAIR imaging. Evidence of a moderate size hemorrhages seen with reduced signal on SWI involving the right posterior frontal parietal region. This area is also associated with increased signal on DWI indicating likely hemorrhagic transformation of an ischemic stroke.    CT is also reviewed and shows a tiny increased signal/hyperdensity involving the right frontal area demonstrate with the findings on SWI an MRI scan. CT also shows marked leukoencephalopathy even more pronounced than on MRI.   Blood pressure (!) 152/64, pulse 75, temperature 98.1 F (36.7 C), temperature source Oral, resp. rate 16, height 6' (1.829 m), weight 154 lb 11.2 oz (70.2 kg), SpO2 100 %.  Past Medical History:  Diagnosis Date  . Hypertension   . Stroke [facility])     History reviewed. No pertinent surgical history.  Family History  Problem Relation Age of Onset  . Hypertension Mother   . Pneumonia Mother   . Lung cancer Father     Social History:  reports that he has never smoked. He has never used smokeless tobacco. He reports that he drinks about 2.4 - 3.0 oz of alcohol per week . He reports that he does not use drugs.  Allergies: No Known Allergies  Medications: Prior to Admission medications   Medication Sig Start Date End Date Taking? Authorizing Provider  ALPRAZolam Duanne Moron) 1 MG tablet Take 1 mg by mouth 3 (three) times daily as needed for anxiety.   Yes Historical Provider, MD  amLODipine-valsartan (EXFORGE) 10-320 MG tablet Take 1 tablet by mouth daily.   Yes Historical Provider, MD  cholecalciferol (VITAMIN D) 1000 units tablet Take 1,000 Units by mouth daily.   Yes Historical Provider, MD  clopidogrel (PLAVIX) 75 MG tablet Take 75 mg by mouth daily. 05/01/16  Yes Historical Provider, MD  escitalopram (LEXAPRO) 20 MG tablet Take 20 mg by mouth daily.   Yes Historical Provider, MD  nebivolol (BYSTOLIC) 10 MG tablet Take 10 mg by mouth daily.   Yes Historical Provider, MD  tamsulosin (FLOMAX) 0.4 MG  CAPS capsule Take 0.4 mg by mouth daily.   Yes Historical Provider, MD    Scheduled Meds: .  stroke: mapping our early stages of recovery book   Does not apply Once  . amLODipine  10 mg Oral Daily   And  . irbesartan  300 mg Oral Daily  . atorvastatin  40 mg Oral q1800  . docusate  200 mg Oral BID  . escitalopram  20 mg Oral Daily  . famotidine (PEPCID) IV  20 mg Intravenous Q12H  . feeding supplement (ENSURE ENLIVE)  237 mL Oral BID BM  . hydrALAZINE  10 mg Oral Q8H  . multivitamin with minerals  1 tablet Oral Daily  . nebivolol  10 mg Oral Daily  . potassium chloride  40 mEq Oral Daily  . sodium chloride flush  3 mL Intravenous Q12H  . tamsulosin  0.4 mg Oral Daily   Continuous Infusions: . dextrose 5 % and 0.9 % NaCl with KCl 20 mEq/L     PRN Meds:.hydrALAZINE     Results for orders placed or performed during the hospital encounter of 05/11/16 (from the past 48 hour(s))  CBC     Status: Abnormal   Collection Time: 05/16/16  2:46 PM  Result Value Ref Range   WBC 7.2 4.0 - 10.5 K/uL   RBC 4.66 4.22 - 5.81 MIL/uL   Hemoglobin 13.8 13.0 - 17.0 g/dL   HCT 42.6 39.0 - 52.0 %   MCV 91.4 78.0 - 100.0 fL   MCH 29.6 26.0 - 34.0 pg   MCHC 32.4 30.0 - 36.0 g/dL   RDW 13.1 11.5 - 15.5 %   Platelets 51 (L) 150 - 400 K/uL    Comment: SPECIMEN CHECKED FOR CLOTS LARGE PLATELETS PRESENT PLATELET COUNT CONFIRMED BY SMEAR   Basic metabolic panel     Status: Abnormal   Collection Time: 05/16/16  2:46 PM  Result Value Ref Range   Sodium 145 135 - 145 mmol/L   Potassium 3.4 (L) 3.5 - 5.1 mmol/L   Chloride 115 (H) 101 - 111 mmol/L   CO2 24 22 - 32 mmol/L   Glucose, Bld 178 (H) 65 - 99 mg/dL   BUN 17 6 - 20 mg/dL   Creatinine, Ser 1.00 0.61 - 1.24 mg/dL   Calcium 8.1 (L) 8.9 - 10.3 mg/dL   GFR calc non Af Amer >60 >60 mL/min   GFR calc Af Amer >60 >60 mL/min    Comment: (NOTE) The eGFR has been calculated using the CKD EPI equation. This calculation has not been validated in all  clinical situations. eGFR's persistently <60 mL/min signify possible Chronic Kidney Disease.    Anion gap 6 5 - 15    Studies/Results:  REPEAT HEAD CT 1- 7- 18 FINDINGS: Brain: Stable 9 mm hyperdense acute right intracranial hemorrhage in the posterior right frontal lobe at the gray-white matter junction as before. No significant interval change.  Stable background atrophy and chronic white matter microvascular ischemic changes. Remote right basal ganglia infarcts with mild ex vacuo dilatation of the right lateral ventricle. No significant mass effect, midline shift, herniation, hydrocephalus, or extra-axial fluid collection. Cisterns remain patent. Cerebellar atrophy as well.  Vascular: Intracranial atherosclerosis noted.  Skull: Normal. Negative for fracture or focal lesion.  Sinuses/Orbits: No acute finding.  Other: None.  IMPRESSION: Stable 9 mm acute subcortical intracranial hemorrhage in the posterior right frontal lobe.  Other chronic findings as above. No significant interval change compared 05/11/2016.       TTE - Left ventricle: The cavity size was normal. Wall thickness was   increased in a pattern of mild LVH. Systolic function was normal.   The estimated ejection fraction was in the range of 50% to 55%.   Wall motion was normal; there were no regional wall motion   abnormalities. Doppler parameters are consistent with abnormal   left ventricular relaxation (grade 1 diastolic dysfunction). - Aortic valve: There was moderate regurgitation. Valve area (VTI):   2.56 cm^2. Valve area (Vmax): 2.6 cm^2. Valve area (Vmean): 2.11   cm^2. - Left atrium: The atrium was mildly dilated. - Atrial septum: No defect or patent foramen ovale was identified. - Technically adequate study.   Glorimar Stroope A. Merlene Bruce, M.D.  Diplomate, Tax adviser of Psychiatry and Neurology ( Neurology). 05/16/2016, 4:11 PM

## 2016-05-16 NOTE — Progress Notes (Addendum)
Initial Nutrition Assessment  DOCUMENTATION CODES:   Severe malnutrition in context of acute illness/injury  INTERVENTION:  Request current weight to assess significance of change x 5 days. Suggest daily weights be obtained.   Ensure Enlive po BID, each supplement provides 350 kcal and 20 grams of protein   Recommend liberalize diet to regular if medically acceptable  MVI daily   NUTRITION DIAGNOSIS:   Malnutrition (severe) related to poor appetite as evidenced by energy intake < or equal to 50% for > or equal to 5 days and moderate muscle loss.   GOAL:   Patient will meet greater than or equal to 90% of their needs  MONITOR:  Po intake, labs and wt trends     REASON FOR ASSESSMENT:   Malnutrition Screening Tool, Consult Assessment of nutrition requirement/status  ASSESSMENT: 81 year old male with history of prior stroke with residual left-sided hemiparesis, presented with generalized weakness and frequent falls not eating much. In the ER CT scan of head showed a small intracranial hemorrhage. MRI of the brain showed numerous small areas of acute cerebral infarction throughout both cerebral hemispheres and cerebellum bilateral.   Patient po intake poor since admission (5 days). He is consuming 0-25% of meals. He has been dehydrated likely associated with decreased food and fluid consumption. His home diet is regular. Limited diet hx due to his inability to provide at this time.   Expect that his weight is down due to his very poor intake but unable to determine the percent change due to the documented weights of 198# on 1/3 at 1624 then 154.11# on 1/3 @ 2332.  Nutrition-Focused physical exam findings are mild fat depletion, moderate muscle depletion, and no edema.      Recent Labs Lab 05/12/16 0544 05/12/16 1120 05/13/16 0632 05/14/16 0700  NA 142  --  144 143  K 2.9* 3.2* 3.3* 3.4*  CL 109  --  112* 114*  CO2 26  --  25 25  BUN 21*  --  16 13  CREATININE 0.77   --  0.87 0.82  CALCIUM 8.6*  --  8.4* 8.3*  MG  --   --  1.7 2.0  GLUCOSE 161*  --  181* 161*   Labs: potassium 3.4, Glucose 161   Meds: potassium , IVF D5 NS @100  ml/hr  Diet Order:  Diet Heart Room service appropriate? Yes; Fluid consistency: Thin  Skin:  Reviewed, no issues  Last BM:  05/14/16  Height:   Ht Readings from Last 1 Encounters:  05/11/16 6' (1.829 m)    Weight:   Wt Readings from Last 1 Encounters:  05/11/16 154 lb 11.2 oz (70.2 kg)    Ideal Body Weight:   81kg  BMI:  Body mass index is 20.98 kg/m.  Estimated Nutritional Needs:   Kcal:  1900-2100  Protein:  80-90 gr  Fluid:  1.8 liters dialy  EDUCATION NEEDS:   No education needs identified at this time  Royann ShiversLynn Chan Rosasco MS,RD,CSG,LDN Office: #[phone] Pager: [phone]

## 2016-05-16 NOTE — Plan of Care (Signed)
Problem: Nutrition: Goal: Risk of aspiration will decrease Outcome: Not Progressing Pt PO intake has decreased and pt having difficulty swallowing PO intake

## 2016-05-16 NOTE — Consult Note (Signed)
Consultation Note Date: 05/16/2016   Patient Name: Ronald Bruce  DOB: [birth year]  MRN: [id number]  Age / Sex: 81 y.o., male  PCP: Ronald Stabile, MD Referring Physician: Maxie Barb, MD  Reason for Consultation: Establishing goals of care and Psychosocial/spiritual support  HPI/Patient Profile: 81 y.o. male  with past medical history of stroke with slight left hemiplegia BPH, likely hypertension based on medication list admitted on 05/11/2016 with intracranial bleeding.   Clinical Assessment and Goals of Care: Ronald Bruce is lying in bed with his sister, Ronald Bruce, and her daughter, Ronald Bruce at bedside.  Ronald Bruce is unable to maintain eye contact or communicate with me in any meaningful way. His sister shares his story of decreased appetite and weakness with some weight loss over the last few weeks. She talks about his course improving and then declining again. She shares her concern that the family had missed the signs and symptoms of stroke that has led to this hospitalization.  We talk about Ronald Bruce functional status. 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 He has recently stopped driving also. We talk about test results including most recent CT, white blood cells normal at 7.7, and pending urinalysis. I encourage Ronald Bruce that we need 24 to 48 hours longer to understand if there's been another event. We talk about the fact that there's no treatment options at this time for his intracerebral hemorrhage, or what is likely been a heart attack. I share my worry about Ronald Bruce current mental state. I share my concern over pupils, although reactive, are different sizes.  We talk about advanced directives. Ronald Bruce states that her brother "would not want to live in this condition". We talk about the concepts of allow a natural death, and Ronald Bruce elects  DNR at this time.  We talk about 2 pathways, focused on returning to prior level of function if possible, versus focusing on comfort. I do not press family for any answers, instead encourage them to give 24 to 48 hours for more information gathering. PMT will continue to follow Ronald Bruce.  Healthcare power of attorney HCPOA - Ronald Bruce. Paperwork copied to be scanned into chart   SUMMARY OF RECOMMENDATIONS   Treat the treatable (possible UTI), but no extraordinary measures. 24 to 48 hours for testing results, ability to improve. We discussed 2 pathways, more aggressive toward regaining function if possible, versus focusing on comfort.  Code Status/Advance Care Planning:  DNR  Symptom Management:   per hospitalist/neurologist  Palliative Prophylaxis:   Aspiration, Frequent Pain Assessment and Turn Reposition  Additional Recommendations (Limitations, Scope, Preferences):  Treat the treatable (possible UTI), but no extraordinary measures. 24 to 48 hours for testing results, ability to improve.  Psycho-social/Spiritual:   Desire for further Chaplaincy support:no  Additional Recommendations: Caregiving  Support/Resources and Education on Hospice  Prognosis:   Unable to determine, based on outcomes over the next 24 to 48 hours, and families desired direction of care.  Discharge Planning: To Be Determined      Primary Diagnoses: Present on Admission: . Dehydration . Elevated troponin   I have reviewed the medical record, interviewed the patient and family, and examined the patient. The following aspects are pertinent.  Past Medical History:  Diagnosis Date  . Hypertension   . Stroke [facility])    Social History   Social History  . Marital status: Widowed    Spouse name: N/A  . Number of children: N/A  . Years of education: N/A   Social History Main Topics  . Smoking status: Never Smoker  . Smokeless tobacco: Never Used  . Alcohol use 2.4 - 3.0 oz/week    4  - 5 Cans of beer per week     Comment: Daily  . Drug use: No  . Sexual activity: No   Other Topics Concern  . None   Social History Narrative  . None   Family History  Problem Relation Age of Onset  . Hypertension Mother   . Pneumonia Mother   . Lung cancer Father    Scheduled Meds: .  stroke: mapping our early stages of recovery book   Does not apply Once  . amLODipine  10 mg Oral Daily   And  . irbesartan  300 mg Oral Daily  . atorvastatin  40 mg Oral q1800  . docusate  200 mg Oral BID  . escitalopram  20 mg Oral Daily  . famotidine (PEPCID) IV  20 mg Intravenous Q12H  . feeding supplement (ENSURE ENLIVE)  237 mL Oral BID BM  . hydrALAZINE  10 mg Oral Q8H  . multivitamin with minerals  1 tablet Oral Daily  . nebivolol  10 mg Oral Daily  . potassium chloride  40 mEq Oral Daily  . sodium chloride flush  3 mL Intravenous Q12H  . tamsulosin  0.4 mg Oral Daily   Continuous Infusions: . dextrose 5 % and 0.9 % NaCl with KCl 20 mEq/L     PRN Meds:.hydrALAZINE Medications Prior to Admission:  Prior to Admission medications   Medication Sig Start Date End Date Taking? Authorizing Provider  ALPRAZolam Prudy Feeler(XANAX) 1 MG tablet Take 1 mg by mouth 3 (three) times daily as needed for anxiety.   Yes Historical Provider, MD  amLODipine-valsartan (EXFORGE) 10-320 MG tablet Take 1 tablet by mouth daily.   Yes Historical Provider, MD  cholecalciferol (VITAMIN D) 1000 units tablet Take 1,000 Units by mouth daily.   Yes Historical Provider, MD  clopidogrel (PLAVIX) 75 MG tablet Take 75 mg by mouth daily. 05/01/16  Yes Historical Provider, MD  escitalopram (LEXAPRO) 20 MG tablet Take 20 mg by mouth daily.   Yes Historical Provider, MD  nebivolol (BYSTOLIC) 10 MG tablet Take 10 mg by mouth daily.   Yes Historical Provider, MD  tamsulosin (FLOMAX) 0.4 MG CAPS capsule Take 0.4 mg by mouth daily.   Yes Historical Provider, MD   No Known Allergies Review of Systems  Unable to perform ROS: Mental  status change    Physical Exam  Constitutional: He appears distressed.  Frail and thin, chronically ill appearing, unable to communicate effectively  HENT:  Head: Atraumatic.  Eyes: Right eye exhibits discharge.  Left pupil 3 MM, right pupil 4 MM  Cardiovascular: Normal rate and regular rhythm.   Pulmonary/Chest: Effort normal. No respiratory distress.  Abdominal: Soft.  Musculoskeletal: He exhibits no edema.  Neurological:  Briefly makes eye contact, unable to communicate in any meaningful way, crying out often  Skin: Skin is warm and dry.  Nursing note and vitals reviewed.   Vital Signs: BP (!) 152/64 (BP Location: Right Arm)   Pulse 75   Temp 98.1 F (36.7 C) (Oral)   Resp 16   Ht 6' (1.829 m)   Wt 70.2 kg (154 lb 11.2 oz)   SpO2 100%   BMI 20.98 kg/m  Pain Assessment: PAINAD   Pain Score: Asleep   SpO2: SpO2: 100 % O2 Device:SpO2: 100 % O2 Flow Rate: .   IO: Intake/output summary:  Intake/Output Summary (Last 24 hours) at 05/16/16 1616 Last data filed at 05/16/16 1130  Gross per 24 hour  Intake          2493.34 ml  Output              800 ml  Net          1693.34 ml    LBM: Last BM Date: 05/14/16 Baseline Weight: Weight: 89.8 kg (198 lb) Most recent weight: Weight: 70.2 kg (154 lb 11.2 oz)     Palliative Assessment/Data:   Flowsheet Rows   Flowsheet Row Most Recent Value  Intake Tab  Referral Department  Hospitalist  Unit at Time of Referral  Med/Surg Unit  Palliative Care Primary Diagnosis  Neurology  Date Notified  05/15/16  Palliative Care Type  New Palliative care  Reason for referral  Clarify Goals of Care  Date of Admission  05/11/16  Date first seen by Palliative Care  05/16/16  # of days Palliative referral response time  1 Day(s)  # of days IP prior to Palliative referral  4  Clinical Assessment  Palliative Performance Scale Score  20%  Pain Max last 24 hours  Not able to report  Pain Min Last 24 hours  Not able to report  Dyspnea  Max Last 24 Hours  Not able to report  Dyspnea Min Last 24 hours  Not able to report  Psychosocial & Spiritual Assessment  Palliative Care Outcomes  Patient/Family meeting held?  Yes  Who was at the meeting?  Sister Corinna Capra, and her daughter Ronald Bruce  Palliative Care Outcomes  Provided advance care planning, Changed CPR status, Provided psychosocial or spiritual support, Clarified goals of care  Patient/Family wishes: Interventions discontinued/not started   Mechanical Ventilation  Palliative Care follow-up planned  -- [Follow-up while at [facility]]      Time In: 1500 Time Out: 1605 Time Total: 65 minutes Greater than 50%  of this time was spent counseling and coordinating care related to the above assessment and plan.  Signed by: Katheran Awe, NP   Please contact Palliative Medicine Team phone at [phone] for questions and concerns.  For individual provider: See Loretha Stapler

## 2016-05-16 NOTE — Progress Notes (Signed)
Physical Therapy Note  Patient Details  Name: Ronald Bruce MRN: [id number] Date of Birth: 10/08/1935 Today's Date: 05/16/2016     Patient not an active candidate/appropriate for rehab per Lillia Carmelasha Dove, NP per medical decline  Lurena Nidamy B Frazier, PTA/CLT [phone]  05/16/2016, 4:52 PM

## 2016-05-16 NOTE — Progress Notes (Signed)
PROGRESS NOTE    Ronald Bruce  ZOX:[id number] DOB: [birth year] DOA: 05/11/2016 PCP: [name], MD   Brief Narrative: 81 year old male with history of prior stroke with residual left-sided hemiparesis, presented with generalized weakness and frequent falls not eating much. In the ER CT scan of head showed a small intracranial hemorrhage. MRI of the brain showed numerous small areas of acute cerebral infarction throughout both cerebral hemispheres and cerebellum bilateral. Also with positive troponin. Neurologist and cardiologist evaluated the patient.  Assessment & Plan:   Principal Problem:   Intracranial bleeding (HCC) Active Problems:   Dehydration   Elevated troponin   Stroke (cerebrum) (HCC)   Hypokalemia   FTT (failure to thrive) in adult   Protein-calorie malnutrition, severe  # Acute cerebral infarction, multiple small areas of ischemia in bilateral cerebral hemispheres and cerebellum likely thrombo- embolic etiology. Also has acute hemorrhagic stroke at right parietal white matter: -MRI MRA : Multiple strokes and intracranial atherosclerotic disease. No large vessel occlusion noticed -Carotid ultrasound result reviewed. No significant stenosis. -PT, OT and social worker evaluation. Patient will be discharged to nursing facility for rehabilitation on discharge.  -Currently on Lipitor 40 mg. Serum LDL level is 73 -Unable to give aspirin or anticoagulation because of intracranial hemorrhage. Discussed with the neurologist and cardiologist. -Echocardiogram with normal systolic function, grade 1 diastolic dysfunction. -Discussed with the cardiologist, Dr. Wyline Mood on 05/13/2016, he will arrange for outpatient cardiac monitoring and possibly TEE.  # Acute change in mental status, possibly due to acute stroke versus acute delirium: -Patient with gradual decline in cognition function. He was able to ambulate and talk 2 days ago. Now he is mostly lethargic and not interactive. CT scan of the  head obtained yesterday with no acute change. Reportedly patient has foul-smelling urine. I'm concerned if infection might have worsened his mental status. I will check urinalysis, CBC and BMP. Depending on lab results he may need antibiotic treatment. -Cognition and swallowing evaluation by theteam. They recommended nothing by mouth at this time. I place patient nothing by mouth, continue IV fluid with electrolytes with dextrose. Pepcid IV twice a day. -I discontinued Vicodin and xanax.  -Monitor mental status closely. -Because of worsening clinical, mental status. I discussed with the neurologist, Dr.[place]oonqu[place] to re-evaluate the patient today.  -Palliative care consult called. I spoke with the palliative team today.  #Elevated troponin level/NSTEMI: Peak troponin level of 1.4. Evaluated by cardiologist. Patient does not have any specific cardiopulmonary symptoms this time. He had generalized weakness and failure to thrive. EKG showed inferior and anterolateral Q waves. Unable to give aspirin or anticoagulation because of intracranial hemorrhage. Echo showed no regional wall motion abnormalities.  #Hypertension: Blood pressure elevated.  Norvasc, Avapro. hydralazine and Bystolic. -Added IV hydralazine as needed for the management of hypertension in case patient does not take orally.  #Hypokalemia: Monitor labs.  # Failure to thrive and possibly mild protein calorie malnutrition: speech and swallow evaluation ongoing, appreciated. Patient will be NPO today. Continue to monitor mental status.  DVT prophylaxis: SCD . Code Status: Full code Family Communication: Patient's sister, niece and son at bedside Disposition Plan: Likely discharge to SNF in 1-2 days  Consultants:   Neurologist and cardiologist  Procedures: MRI and MRA brain, carotid ultrasound. Echo  Antimicrobials: None  Subjective: Patient was seen and examined at bedside. Patient with gradual decline in mental status. He was  minimally elevated today. Mostly somnolent. Unable to obtain review of systems the patient. Family members at bedside.  Objective: Vitals:  05/15/16 1554 05/15/16 2140 05/16/16 0140 05/16/16 0540  BP: (!) 163/78 (!) 146/68 (!) 156/65 (!) 152/64  Pulse: 81 77 75 75  Resp: 18 14 15 16   Temp: 97.9 F (36.6 C) 98.2 F (36.8 C) 97.9 F (36.6 C) 98.1 F (36.7 C)  TempSrc: Oral Oral Oral Oral  SpO2: 96% 95% 97% 100%  Weight:      Height:        Intake/Output Summary (Last 24 hours) at 05/16/16 1455 Last data filed at 05/16/16 1130  Gross per 24 hour  Intake          2493.34 ml  Output              800 ml  Net          1693.34 ml   Filed Weights   05/11/16 1624 05/11/16 2332  Weight: 89.8 kg (198 lb) 70.2 kg (154 lb 11.2 oz)    Examination:  General exam: Somnolent but waking up with the name. Respiratory system: Clear bilateral, no wheezing or crackle Cardiovascular system: Regular rate rhythm, S1-S2 normal. No pedal edema..  Gastrointestinal system: Bowel sound positive, soft, nontender. Nondistended. Central nervous system: Somnolent and not talking today. Extremities: Patient not following commands today therefore unable to assess.  Skin: No rashes, lesions or ulcers Psychiatry: Unable to assess.     Data Reviewed: I have personally reviewed following labs and imaging studies  CBC:  Recent Labs Lab 05/11/16 1704  WBC 7.2  HGB 15.4  HCT 46.6  MCV 92.6  PLT 104*   Basic Metabolic Panel:  Recent Labs Lab 05/11/16 1704 05/12/16 0544 05/12/16 1120 05/13/16 0632 05/14/16 0700  NA 142 142  --  144 143  K 3.0* 2.9* 3.2* 3.3* 3.4*  CL 107 109  --  112* 114*  CO2 26 26  --  25 25  GLUCOSE 167* 161*  --  181* 161*  BUN 26* 21*  --  16 13  CREATININE 1.06 0.77  --  0.87 0.82  CALCIUM 9.2 8.6*  --  8.4* 8.3*  MG  --   --   --  1.7 2.0   GFR: Estimated Creatinine Clearance: 73.7 mL/min (by C-G formula based on SCr of 0.82 mg/dL). Liver Function  Tests:  Recent Labs Lab 05/11/16 1704  AST 22  ALT 8*  ALKPHOS 367*  BILITOT 0.7  PROT 7.0  ALBUMIN 3.6   No results for input(s): LIPASE, AMYLASE in the last 168 hours.  Recent Labs Lab 05/11/16 1927  AMMONIA 23   Coagulation Profile: No results for input(s): INR, PROTIME in the last 168 hours. Cardiac Enzymes:  Recent Labs Lab 05/11/16 1827 05/11/16 2349 05/12/16 0544 05/12/16 1120 05/13/16 0632  CKTOTAL 146  --   --   --   --   TROPONINI 1.32* 1.31* 1.20* 1.13* 1.44*   BNP (last 3 results) No results for input(s): PROBNP in the last 8760 hours. HbA1C: No results for input(s): HGBA1C in the last 72 hours. CBG:  Recent Labs Lab 05/11/16 1643  GLUCAP 156*   Lipid Profile: No results for input(s): CHOL, HDL, LDLCALC, TRIG, CHOLHDL, LDLDIRECT in the last 72 hours. Thyroid Function Tests: No results for input(s): TSH, T4TOTAL, FREET4, T3FREE, THYROIDAB in the last 72 hours. Anemia Panel: No results for input(s): VITAMINB12, FOLATE, FERRITIN, TIBC, IRON, RETICCTPCT in the last 72 hours. Sepsis Labs: No results for input(s): PROCALCITON, LATICACIDVEN in the last 168 hours.  No results found for this or any  previous visit (from the past 240 hour(s)).       Radiology Studies: Ct Head Wo Contrast  Result Date: 05/15/2016 CLINICAL DATA:  Aphasic, intracranial hemorrhage EXAM: CT HEAD WITHOUT CONTRAST TECHNIQUE: Contiguous axial images were obtained from the base of the skull through the vertex without intravenous contrast. COMPARISON:  05/11/2016 FINDINGS: Brain: Stable 9 mm hyperdense acute right intracranial hemorrhage in the posterior right frontal lobe at the gray-white matter junction as before. No significant interval change. Stable background atrophy and chronic white matter microvascular ischemic changes. Remote right basal ganglia infarcts with mild ex vacuo dilatation of the right lateral ventricle. No significant mass effect, midline shift, herniation,  hydrocephalus, or extra-axial fluid collection. Cisterns remain patent. Cerebellar atrophy as well. Vascular: Intracranial atherosclerosis noted. Skull: Normal. Negative for fracture or focal lesion. Sinuses/Orbits: No acute finding. Other: None. IMPRESSION: Stable 9 mm acute subcortical intracranial hemorrhage in the posterior right frontal lobe. Other chronic findings as above. No significant interval change compared 05/11/2016. Electronically Signed   By: Judie Petit.  Shick M.D.   On: 05/15/2016 14:36        Scheduled Meds: .  stroke: mapping our early stages of recovery book   Does not apply Once  . amLODipine  10 mg Oral Daily   And  . irbesartan  300 mg Oral Daily  . atorvastatin  40 mg Oral q1800  . escitalopram  20 mg Oral Daily  . famotidine (PEPCID) IV  20 mg Intravenous Q12H  . feeding supplement (ENSURE ENLIVE)  237 mL Oral BID BM  . hydrALAZINE  10 mg Oral Q8H  . multivitamin with minerals  1 tablet Oral Daily  . nebivolol  10 mg Oral Daily  . potassium chloride  40 mEq Oral Daily  . sodium chloride flush  3 mL Intravenous Q12H  . tamsulosin  0.4 mg Oral Daily   Continuous Infusions: . dextrose 5 % and 0.9 % NaCl with KCl 20 mEq/L       LOS: 5 days    Dron Jaynie Collins, MD [facility] Pager [phone]  If 7PM-7AM, please contact night-coverage [email or web address] Password [email or web address] 05/16/2016, 2:55 PM

## 2016-05-16 NOTE — Evaluation (Signed)
Speech Language Pathology Evaluation Patient Details Name: Ronald Bruce MRN: [id number] DOB: [birth year] Today's Date: 05/16/2016 Time: 1230-1300 SLP Time Calculation (min) (ACUTE ONLY): 30 min  Problem List:  Patient Active Problem List   Diagnosis Date Noted  . Protein-calorie malnutrition, severe 05/16/2016  . FTT (failure to thrive) in adult   . Stroke (cerebrum) (HCC)   . Hypokalemia   . Intracranial bleeding (HCC) 05/11/2016  . Dehydration 05/11/2016  . Elevated troponin 05/11/2016   Past Medical History:  Past Medical History:  Diagnosis Date  . Hypertension   . Stroke [facility])    Past Surgical History: History reviewed. No pertinent surgical history. HPI:  Pt is a 81 year old male with history of prior stroke with residual left-sided hemiparesis, presented with generalized weakness and frequent falls not eating much. In the ER CT scan of head showed a small intracranial hemorrhage. MRI of the brain showed numerous small areas of acute cerebral infarction throughout both cerebral hemispheres and cerebellum bilateral. Also with positive troponin. Neurologist and cardiologist evaluated the patient.   Assessment / Plan / Recommendation Clinical Impression  Pt seen at bedside for cognitive linguistic evaluation. Pt's niece (who is Charity fundraiser) and sister in room and provided background information. They shared that Pt is "very different" than when seen on Saturday. Ronald Bruce is not communicating his needs and only shouting out, "Hey!". Pt currently presenting with moderate/severe cognitive linguistic deficits characterized by decreased attention, auditory comprehension, and verbal expression all negatively impacted by agitation. Pt responded to personal/bio yes/no questions with 70% acc and followed 1-step commands with 50% acc (high context). Pt required total assist for redirection (shouting "hey", grabbing SLP's arm/hand and squeezing, biting down). It is difficult to determine if pt's cognitive  linguistic deficits are due to neurogenic etiology from stroke or metabolic changes. Pt responds with "no" when asked if in pain, however he presents with facial grimmace, verbal agitation, and physical agitation. SLP will follow while in acute setting for cognitive linguistic deficits and dysphagia (pt now NPO).    SLP Assessment  Patient needs continued Speech Lanaguage Pathology Services    Follow Up Recommendations  Skilled Nursing facility    Frequency and Duration min 2x/week  1 week      SLP Evaluation Cognition  Overall Cognitive Status:  (h/o previous CVA but family states functional cog) Arousal/Alertness: Awake/alert Orientation Level: Oriented to person;Disoriented to place;Disoriented to time;Disoriented to situation Attention: Sustained Sustained Attention: Impaired Sustained Attention Impairment: Verbal basic;Functional basic Memory:  (could not assess) Awareness: Impaired Awareness Impairment: Emergent impairment Problem Solving: Impaired Problem Solving Impairment: Verbal basic;Functional basic Behaviors: Restless;Physical agitation;Poor frustration tolerance Safety/Judgment: Impaired       Comprehension  Auditory Comprehension Overall Auditory Comprehension: Impaired Yes/No Questions: Impaired Basic Biographical Questions: 51-75% accurate Commands: Impaired One Step Basic Commands: 50-74% accurate Conversation: Simple Interfering Components: Attention;Pain EffectiveTechniques: Stressing words;Pausing;Repetition IT trainer Discrimination: Not tested Reading Comprehension Reading Status: Not tested    Expression Expression Primary Mode of Expression: Verbal Verbal Expression Overall Verbal Expression: Impaired Initiation: Impaired Automatic Speech: Name (all impaired) Level of Generative/Spontaneous Verbalization: Word Repetition: Impaired Level of Impairment: Word level Naming: Impairment Responsive: 0-25%  accurate Confrontation: Impaired Convergent: 0-24% accurate Divergent: 0-24% accurate Pragmatics: Impairment Impairments: Abnormal affect Non-Verbal Means of Communication: Not applicable Written Expression Dominant Hand: Right Written Expression: Not tested   Oral / Motor  Oral Motor/Sensory Function Overall Oral Motor/Sensory Function:  (difficult to assess due to decreased cognition) Motor Speech Overall Motor Speech: Impaired Respiration: Within functional limits  Phonation: Normal Resonance: Within functional limits Articulation: Within functional limitis Intelligibility: Intelligibility reduced   Thank you,  Havery MorosDabney Gerrell Tabet, [name] [phone]                     Thorn Demas 05/16/2016, 3:42 PM    Saeed Toren 05/16/2016,3:41 PM

## 2016-05-17 ENCOUNTER — Inpatient Hospital Stay (HOSPITAL_COMMUNITY): Payer: Medicare HMO

## 2016-05-17 ENCOUNTER — Inpatient Hospital Stay (HOSPITAL_COMMUNITY)
Admit: 2016-05-17 | Discharge: 2016-05-17 | Disposition: A | Discharge status: Home / Self Care | Payer: Medicare HMO | Attending: Neurology | Admitting: Neurology

## 2016-05-17 LAB — CBC
HCT: 40.9 % (ref 39.0–52.0)
Hemoglobin: 13.6 g/dL (ref 13.0–17.0)
MCH: 30.2 pg (ref 26.0–34.0)
MCHC: 33.3 g/dL (ref 30.0–36.0)
MCV: 90.7 fL (ref 78.0–100.0)
PLATELETS: 57 10*3/uL — AB (ref 150–400)
RBC: 4.51 MIL/uL (ref 4.22–5.81)
RDW: 13.1 % (ref 11.5–15.5)
WBC: 7.2 10*3/uL (ref 4.0–10.5)

## 2016-05-17 LAB — HOMOCYSTEINE: Homocysteine: 24.5 umol/L — ABNORMAL HIGH (ref 0.0–15.0)

## 2016-05-17 MED ORDER — HYDRALAZINE HCL 20 MG/ML IJ SOLN
10.0000 mg | Freq: Three times a day (TID) | INTRAMUSCULAR | Status: DC
Start: 2016-05-17 — End: 2016-05-22
  Administered 2016-05-17 – 2016-05-22 (×13): 10 mg via INTRAVENOUS
  Filled 2016-05-17 (×13): qty 1

## 2016-05-17 MED ORDER — RESOURCE THICKENUP CLEAR PO POWD
ORAL | Status: DC | PRN
  Filled 2016-05-17: qty 125

## 2016-05-17 MED ORDER — LORAZEPAM 2 MG/ML IJ SOLN
0.5000 mg | INTRAMUSCULAR | Status: AC
  Administered 2016-05-17: 0.5 mg via INTRAVENOUS
  Filled 2016-05-17: qty 1

## 2016-05-17 NOTE — Progress Notes (Signed)
Daily Progress Note   Patient Name: Ronald Bruce       Date: 05/17/2016 DOB: 08/21/1935  Age: 81 y.o. MRN#: [id number] Attending Physician: Maxie Barbron Prasad Bhandari, MD Primary Care Physician: Dwana MelenaZack Hall, MD Admit Date: 05/11/2016  Reason for Consultation/Follow-up: Disposition, Establishing goals of care and Psychosocial/spiritual support  Subjective: Ronald Bruce is resting quietly in bed, he will at times throw his right arm over his head, grabbing the sheet. He is able to make and keep eye contact, although limited. He denies hunger pain at this time. Present today at bedside is his sister/HCP away, Carlos LeveringFrances Chatmon.  [name] is very encouraged by [name]'s improvement in mental status. She asks him repeated questions about where he is and things that they've talked about earlier in the day. She states that he is able to take ice chips without coughing or choking. We talk about feeding tubes, and Thelma BargeFrancis agrees that as long as her brother can eat food that is the best option, she would not want him to have a feeding tube.  We talk about the treatment plan, IV antibiotics for UTI, MRI of the brain, EEG. And again, 24 to 48 hours for test results and to see improvements if possible. I encourage [name] to let Ronald Bruce rest as much as possible, and that I will continue to see them daily.  Francis's daughter, Verlon AuLeslie, has an appointment with surgeon today, she has breast cancer. Encouragement and support given.  Length of Stay: 6  Current Medications: Scheduled Meds:  .  stroke: mapping our early stages of recovery book   Does not apply Once  . amLODipine  10 mg Oral Daily   And  . irbesartan  300 mg Oral Daily  . atorvastatin  40 mg Oral q1800  . cefTRIAXone (ROCEPHIN)  IV  1 g Intravenous Q24H  .  cyanocobalamin  1,000 mcg Intramuscular 1 day or 1 dose  . docusate  200 mg Oral BID  . escitalopram  20 mg Oral Daily  . feeding supplement (ENSURE ENLIVE)  237 mL Oral BID BM  . hydrALAZINE  10 mg Oral Q8H  . multivitamin with minerals  1 tablet Oral Daily  . nebivolol  10 mg Oral Daily  . potassium chloride  40 mEq Oral Daily  . QUEtiapine  50 mg Oral QHS  .  sodium chloride flush  3 mL Intravenous Q12H  . tamsulosin  0.4 mg Oral Daily    Continuous Infusions: . dextrose 5 % and 0.9 % NaCl with KCl 20 mEq/L 100 mL/hr at 05/17/16 0736    PRN Meds: haloperidol lactate, hydrALAZINE  Physical Exam  Constitutional: No distress.  Frail and thin, chronically ill appearing, mildly agitated, throwing right arm over his head and grabbing sheet  HENT:  Head: Normocephalic and atraumatic.  Cardiovascular: Normal rate.   Pulmonary/Chest: Effort normal. No respiratory distress.  Abdominal: Soft. He exhibits no distension.  Musculoskeletal: He exhibits no edema.  Neurological: He is alert.  Makes and somewhat keeps eye contact, oriented to person and place today  Skin: Skin is warm and dry.  Areas of bruising to the arms  Nursing note and vitals reviewed.           Vital Signs: BP (!) 169/77 (BP Location: Right Arm)   Pulse (!) 8   Temp 97.7 F (36.5 C) (Axillary)   Resp 18   Ht 6' (1.829 m)   Wt 70.2 kg (154 lb 11.2 oz)   SpO2 96%   BMI 20.98 kg/m  SpO2: SpO2: 96 % O2 Device: O2 Device: Not Delivered O2 Flow Rate:    Intake/output summary:  Intake/Output Summary (Last 24 hours) at 05/17/16 1303 Last data filed at 05/17/16 0830  Gross per 24 hour  Intake             1965 ml  Output              900 ml  Net             1065 ml   LBM: Last BM Date: 05/14/16 Baseline Weight: Weight: 89.8 kg (198 lb) Most recent weight: Weight: 70.2 kg (154 lb 11.2 oz)       Palliative Assessment/Data:    Flowsheet Rows   Flowsheet Row Most Recent Value  Intake Tab  Referral  Department  Hospitalist  Unit at Time of Referral  Med/Surg Unit  Palliative Care Primary Diagnosis  Neurology  Date Notified  05/15/16  Palliative Care Type  New Palliative care  Reason for referral  Clarify Goals of Care  Date of Admission  05/11/16  Date first seen by Palliative Care  05/16/16  # of days Palliative referral response time  1 Day(s)  # of days IP prior to Palliative referral  4  Clinical Assessment  Palliative Performance Scale Score  20%  Pain Max last 24 hours  Not able to report  Pain Min Last 24 hours  Not able to report  Dyspnea Max Last 24 Hours  Not able to report  Dyspnea Min Last 24 hours  Not able to report  Psychosocial & Spiritual Assessment  Palliative Care Outcomes  Patient/Family meeting held?  Yes  Who was at the meeting?  Sister Corinna Capra, and her daughter Verlon Au  Palliative Care Outcomes  Provided advance care planning, Changed CPR status, Provided psychosocial or spiritual support, Clarified goals of care  Patient/Family wishes: Interventions discontinued/not started   Mechanical Ventilation  Palliative Care follow-up planned  -- [Follow-up while at [facility]]      Patient Active Problem List   Diagnosis Date Noted  . Protein-calorie malnutrition, severe 05/16/2016  . Palliative care encounter   . Goals of care, counseling/discussion   . DNR (do not resuscitate) discussion   . FTT (failure to thrive) in adult   . Stroke (cerebrum) (HCC)   .  Hypokalemia   . Intracranial bleeding (HCC) 05/11/2016  . Dehydration 05/11/2016  . Elevated troponin 05/11/2016    Palliative Care Assessment & Plan   Patient Profile: 81 y.o. male  with past medical history of stroke with slight left hemiplegia BPH, likely hypertension based on medication list admitted on 05/11/2016 with intracranial bleeding.   Assessment: Intracranial hemorrhage; stable at this time, no interventions. Increased troponins; unable to take aspirin or anti-platelets at this time due  to intracerebral hemorrhage. Failure to thrive in adult; functional decline over the last several months. Rehab if possible.  Recommendations/Plan:  treat the treatable, rehab if possible.  Goals of Care and Additional Recommendations:  Limitations on Scope of Treatment: Treat the treatable, but no extraordinary measures such as CPR or intubation. PEG tube discussed again today with sister/HC POA Scarlette Calico. It is unlikely that she would choose a feeding tube, instead focusing on comfort feedings if necessary..  Code Status:    Code Status Orders        Start     Ordered   05/16/16 1601  Do not attempt resuscitation (DNR)  Continuous    Question Answer Comment  In the event of cardiac or respiratory ARREST Do not call a "code blue"   In the event of cardiac or respiratory ARREST Do not perform Intubation, CPR, defibrillation or ACLS   In the event of cardiac or respiratory ARREST Use medication by any route, position, wound care, and other measures to relive pain and suffering. May use oxygen, suction and manual treatment of airway obstruction as needed for comfort.      05/16/16 1601    Code Status History    Date Active Date Inactive Code Status Order ID Comments User Context   05/11/2016 11:37 PM 05/16/2016  4:01 PM Full Code [id number]  Houston Siren, MD Inpatient    Advance Directive Documentation   Flowsheet Row Most Recent Value  Type of Advance Directive  Healthcare Power of Attorney  Pre-existing out of facility DNR order (yellow form or pink MOST form)  No data  "MOST" Form in Place?  No data       Prognosis:   Unable to determine , based on outcomes over the next 24 to 48 hours, and families desire direction of care.  Discharge Planning:  To be determined, rehab if possible.  Care plan was discussed with nursing staff, case manager, social worker, and Dr. Ronalee Belts on next rounds.  Thank you for allowing the Palliative Medicine Team to assist in the care of this  patient.   Time In: 1010 Time Out: 1045 Total Time 35 Minutes  Prolonged Time Billed  no       Greater than 50%  of this time was spent counseling and coordinating care related to the above assessment and plan.  Katheran Awe, NP  Please contact Palliative Medicine Team phone at [phone] for questions and concerns.

## 2016-05-17 NOTE — Procedures (Signed)
  [facility] Jc Veron A. Gerilyn Pilgrimoonquah, MD     [email or web address]           HISTORY: The patient is a [age] year-year-old man who presents with recurring confusion and altered mental status. The studies being done to evaluate for seizures as etiology of the confusion.  MEDICATIONS: Scheduled Meds: .  stroke: mapping our early stages of recovery book   Does not apply Once  . amLODipine  10 mg Oral Daily   And  . irbesartan  300 mg Oral Daily  . atorvastatin  40 mg Oral q1800  . cefTRIAXone (ROCEPHIN)  IV  1 g Intravenous Q24H  . docusate  200 mg Oral BID  . escitalopram  20 mg Oral Daily  . feeding supplement (ENSURE ENLIVE)  237 mL Oral BID BM  . hydrALAZINE  10 mg Intravenous Q8H  . multivitamin with minerals  1 tablet Oral Daily  . nebivolol  10 mg Oral Daily  . potassium chloride  40 mEq Oral Daily  . QUEtiapine  50 mg Oral QHS  . sodium chloride flush  3 mL Intravenous Q12H  . tamsulosin  0.4 mg Oral Daily   Continuous Infusions: . dextrose 5 % and 0.9 % NaCl with KCl 20 mEq/L 100 mL/hr at 05/17/16 0736   PRN Meds:.haloperidol lactate  Prior to Admission medications   Medication Sig Start Date End Date Taking? Authorizing Provider  ALPRAZolam Prudy Feeler(XANAX) 1 MG tablet Take 1 mg by mouth 3 (three) times daily as needed for anxiety.   Yes Historical Provider, MD  amLODipine-valsartan (EXFORGE) 10-320 MG tablet Take 1 tablet by mouth daily.   Yes Historical Provider, MD  cholecalciferol (VITAMIN D) 1000 units tablet Take 1,000 Units by mouth daily.   Yes Historical Provider, MD  clopidogrel (PLAVIX) 75 MG tablet Take 75 mg by mouth daily. 05/01/16  Yes Historical Provider, MD  escitalopram (LEXAPRO) 20 MG tablet Take 20 mg by mouth daily.   Yes Historical Provider, MD  nebivolol (BYSTOLIC) 10 MG tablet Take 10 mg by mouth daily.   Yes Historical Provider, MD  tamsulosin (FLOMAX) 0.4 MG CAPS capsule Take 0.4 mg by mouth daily.   Yes Historical Provider, MD      ANALYSIS: A  16 channel recording using standard 10 20 measurements is conducted for 21 minutes. The background activity is slow and somewhat slower on the right side. On the left he gets as high as 7 and on the left mostly in the 6 range. There is beta activity observing the frontal areas. Occasional global delta activity seen most in the frontal region. Photic simulation and hypoventilation are not carried out. There is no focal slowing. There is no epileptiform activities observed. The patient did have movements and restlessness throughout the recording without EEG correlates.    IMPRESSION: This recording is shows generalized slowing more prominent on the right side. There is no epileptiform activities observed however.      Windy Dudek A. Gerilyn Pilgrimoonquah, M.D.  Diplomate, Biomedical engineerAmerican Board of Psychiatry and Neurology ( Neurology).

## 2016-05-17 NOTE — Progress Notes (Signed)
Speech Language Pathology Treatment: Dysphagia;Cognitive-Linquistic  Patient Details Name: Ronald Bruce MRN: [id number] DOB: 08/14/1935 Today's Date: 05/17/2016 Time: [id number] SLP Time Calculation (min) (ACUTE ONLY): 57 min  Assessment / Plan / Recommendation Clinical Impression  Pt seen twice today in split sessions to target both cognitive communication skills and dysphagia (11:45-12:42 and 5:00-5:25 PM) with family present. Mr. Ronald Bruce's cognitive communication status is significantly improved today compared to yesterday. Pt is now communicating/conversing with family and staff. He is able to follow simple commands and is more easily redirected. He continues to "holler" and yell out at times, but is generally soothed by calm voice and redirection. He readily allowed oral care today without clamping down. He required max assist for po trials due to cognitive based dysphagia. SLP provided verbal cues to open mouth, close lips, swallow, etc. He responded best with ice cream/sherbet and popsicle likely due to increased oral sensation which helped him attend to task. Pt with significant pocketing of puree bilaterally and SLP provided tactile cues to encourage Pt to clear. He benefited from bites of sherbet in between each bite of puree to facilitate swallow and oral clearance. Pt unable to successfully use straw today (bites down) and he took a large bite out a styrofoam cup (all pieces were successfully removed). Pt with improved performance with hard cup, however he demonstrates poor oral control with labial spillage.   Recommend a cautious initiation of D1/puree and NTL (nectar-thick liquids) with strict 1:1 feeder assist for skilled feeding due to cognitive deficits. Do not use plastic utensils or styrofoam cups as pt will likely bite and break them (also feeder needs to watch their fingers!). Provide calm and quiet environment (ask visitors to step outside). Pt will need total assist for all oral care. OK  for pt to have ice chips/thin water after oral care if alert and provided by RN. SLP to follow while in acute setting.    HPI HPI: Pt is a 81 year old male with history of prior stroke with residual left-sided hemiparesis, presented with generalized weakness and frequent falls not eating much. In the ER CT scan of head showed a small intracranial hemorrhage. MRI of the brain showed numerous small areas of acute cerebral infarction throughout both cerebral hemispheres and cerebellum bilateral. Also with positive troponin. Neurologist and cardiologist evaluated the patient.      SLP Plan  Continue with current plan of care     Recommendations  Diet recommendations: Dysphagia 1 (puree);Nectar-thick liquid Liquids provided via: No straw;Cup;Teaspoon (no plastic utensils or styrofoam cups) Medication Administration: Crushed with puree (follow with bites of ice cream) Supervision: Staff to assist with self feeding;Full supervision/cueing for compensatory strategies Compensations: Minimize environmental distractions;Slow rate;Monitor for anterior loss Postural Changes and/or Swallow Maneuvers: Seated upright 90 degrees;Upright 30-60 min after meal                General recommendations: OT consult (re-consult due to change in mental status) Oral Care Recommendations: Oral care BID;Oral care prior to ice chip/H20;Oral care before and after PO;Staff/trained caregiver to provide oral care Follow up Recommendations: Skilled Nursing facility Plan: Continue with current plan of care       Thank you,  Havery MorosDabney Porter, [name] [phone]                 PORTER,DABNEY 05/17/2016, 6:03 PM

## 2016-05-17 NOTE — Progress Notes (Signed)
[place] A. Merlene Laughter, MD     [email or web address]          Ronald Bruce is an 81 y.o. male.   ASSESSMENT/PLAN:       UPDATE 05-18-15 The family is pleased with the progress overnight. He appears to be less confused and much less agitated today. He has been able to swallow and his diet has been upgraded from nothing by mouth  to soft diet. The report that he is much less combative. Unfortunately, the MRI results show multiple new small infarcts bilaterally. This is undoubtedly cardio embolic phenomena. However, given the patient's thrombocytopenia and small intracranial hemorrhage, I do not think that we can start the patient on anticoagulation. The case discussed at length with the family. We will repeat a CBC and may consider adding aspirin if his play constant have improved. I did discuss with the daughter to consider hospice versus long-term nursing home placement. Given the multiple infarcts, think his long-term prognosis for functional outcome is likely poor.    UPDATE 05-17-15 The patient has been quite confused and agitated over the weekend. He has been pulling at things and hitting after staff. The family has been concerned. Extensive discussions were done with the family. He has been started on Pepcid as he only new medications. Repeat imaging shows essentially no significant changes. Repeat labs are also unrevealing. There reports that he has not had significant cognitive impairment dementia before that he did have some short-term memory impairment at baseline. I will discontinue the Pepcid. EEG will be obtained. Baseline oximetry on room air will also be done. Because of the agitation seroquel will be added. If the above workup is unrevealing, repeat MRI will be done although he will most likely need to be sedated.      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 The etiology is most likely due to acute MI with embolic phenomena or atrial  fibrillation. Valve disease is also an issue. The patient is to be kept off antiplatelet agents or anticoagulation for the next month.  We are somewhat in a difficult situation in treating this patient. He does have throbocytopenia and has had multiple falls in the last 3 weeks. I think we will hold antiplatelet agents and anticoagulation as outlined above for 1 months. A 30 day event monitor is suggested. If he does have evidence of cardio embolic stroke either on echo or a 30 day event monitor, anticoagulation will be suggested if his fall risk improves. Otherwise antiplatelet agents is suggested.    Suspect the patient likely also has mild to moderate cognitive impairment at baseline. The patient most likely has vascular dementia. Dementia labs will be obtained.   Thrombocytopenia.  Vitamin B12 deficiency. Replacement will be initiated.   Hyperglycemia.         GENERAL: Pleasant average weight male in no acute distress.  HEENT: Supple. Atraumatic normocephalic.   ABDOMEN: soft  EXTREMITIES: No edema   BACK: Normal.  SKIN: Normal by inspection.    MENTAL STATUS:  The patient is sleeping but arousable to light sternal rub and much less agitated and combative. He states that he is doing okay and raises his right hand to command.  CRANIAL NERVES: Pupils are - right pupil 5 mm and left 6 both are reactive; extra ocular movements are full, there is no significant nystagmus; visual fields are full; upper and lower facial muscles are normal in strength and symmetric, there is no flattening of the nasolabial folds; tongue is midline;  uvula is midline; shoulder elevation is normal.  MOTOR: He moves both sides well.  COORDINATION: Left finger to nose is normal, right finger to nose is normal, No rest tremor; no intention tremor; no postural tremor; no bradykinesia.  REFLEXES: Deep tendon reflexes are symmetrical and normal.   SENSATION: He responds to painful stem light  bilaterally.    The repeat MRI is reviewed in person shows multiple new acute infarcts over the previous infarcts below their mostly seen in the cerebral hemispheres bilaterally. There is seen in the left posterior frontal, right frontal and right parietal areas.   The patient's brain MRI is reviewed in person. There are multiple small and moderate-sized acute lesion seen on DWI involving the cerebellum and cerebrum bilaterally. These are mostly deep white matter infarcts. 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 Evidence of a moderate size hemorrhages seen with reduced signal on SWI involving the right posterior frontal parietal region. This area is also associated with increased signal on DWI indicating likely hemorrhagic transformation of an ischemic stroke.    CT is also reviewed and shows a tiny increased signal/hyperdensity involving the right frontal area demonstrate with the findings on SWI an MRI scan. CT also shows marked leukoencephalopathy even more pronounced than on MRI.   Blood pressure (!) 147/68, pulse 79, temperature 97.8 F (36.6 C), temperature source Oral, resp. rate 20, height 6' (1.829 m), weight 154 lb 11.2 oz (70.2 kg), SpO2 100 %.  Past Medical History:  Diagnosis Date  . Hypertension   . Stroke [facility])     History reviewed. No pertinent surgical history.  Family History  Problem Relation Age of Onset  . Hypertension Mother   . Pneumonia Mother   . Lung cancer Father     Social History:  reports that he has never smoked. He has never used smokeless tobacco. He reports that he drinks about 2.4 - 3.0 oz of alcohol per week . He reports that he does not use drugs.  Allergies: No Known Allergies  Medications: Prior to Admission medications   Medication Sig Start Date End Date Taking? Authorizing Provider  ALPRAZolam Duanne Moron) 1 MG tablet Take 1 mg by mouth 3 (three) times daily as needed for anxiety.   Yes Historical  Provider, MD  amLODipine-valsartan (EXFORGE) 10-320 MG tablet Take 1 tablet by mouth daily.   Yes Historical Provider, MD  cholecalciferol (VITAMIN D) 1000 units tablet Take 1,000 Units by mouth daily.   Yes Historical Provider, MD  clopidogrel (PLAVIX) 75 MG tablet Take 75 mg by mouth daily. 05/01/16  Yes Historical Provider, MD  escitalopram (LEXAPRO) 20 MG tablet Take 20 mg by mouth daily.   Yes Historical Provider, MD  nebivolol (BYSTOLIC) 10 MG tablet Take 10 mg by mouth daily.   Yes Historical Provider, MD  tamsulosin (FLOMAX) 0.4 MG CAPS capsule Take 0.4 mg by mouth daily.   Yes Historical Provider, MD    Scheduled Meds: .  stroke: mapping our early stages of recovery book   Does not apply Once  . amLODipine  10 mg Oral Daily   And  . irbesartan  300 mg Oral Daily  . atorvastatin  40 mg Oral q1800  . cefTRIAXone (ROCEPHIN)  IV  1 g Intravenous Q24H  . docusate  200 mg Oral BID  . escitalopram  20 mg Oral Daily  . feeding supplement (ENSURE ENLIVE)  237 mL Oral BID BM  . hydrALAZINE  10 mg Intravenous Q8H  . multivitamin with  minerals  1 tablet Oral Daily  . nebivolol  10 mg Oral Daily  . potassium chloride  40 mEq Oral Daily  . QUEtiapine  50 mg Oral QHS  . sodium chloride flush  3 mL Intravenous Q12H  . tamsulosin  0.4 mg Oral Daily   Continuous Infusions: . dextrose 5 % and 0.9 % NaCl with KCl 20 mEq/L 100 mL/hr at 05/17/16 0736   PRN Meds:.haloperidol lactate     Results for orders placed or performed during the hospital encounter of 05/11/16 (from the past 48 hour(s))  CBC     Status: Abnormal   Collection Time: 05/16/16  2:46 PM  Result Value Ref Range   WBC 7.2 4.0 - 10.5 K/uL   RBC 4.66 4.22 - 5.81 MIL/uL   Hemoglobin 13.8 13.0 - 17.0 g/dL   HCT 42.6 39.0 - 52.0 %   MCV 91.4 78.0 - 100.0 fL   MCH 29.6 26.0 - 34.0 pg   MCHC 32.4 30.0 - 36.0 g/dL   RDW 13.1 11.5 - 15.5 %   Platelets 51 (L) 150 - 400 K/uL    Comment: SPECIMEN CHECKED FOR CLOTS LARGE  PLATELETS PRESENT PLATELET COUNT CONFIRMED BY SMEAR   Basic metabolic panel     Status: Abnormal   Collection Time: 05/16/16  2:46 PM  Result Value Ref Range   Sodium 145 135 - 145 mmol/L   Potassium 3.4 (L) 3.5 - 5.1 mmol/L   Chloride 115 (H) 101 - 111 mmol/L   CO2 24 22 - 32 mmol/L   Glucose, Bld 178 (H) 65 - 99 mg/dL   BUN 17 6 - 20 mg/dL   Creatinine, Ser 1.00 0.61 - 1.24 mg/dL   Calcium 8.1 (L) 8.9 - 10.3 mg/dL   GFR calc non Af Amer >60 >60 mL/min   GFR calc Af Amer >60 >60 mL/min    Comment: (NOTE) The eGFR has been calculated using the CKD EPI equation. This calculation has not been validated in all clinical situations. eGFR's persistently <60 mL/min signify possible Chronic Kidney Disease.    Anion gap 6 5 - 15  Urinalysis, Routine w reflex microscopic     Status: Abnormal   Collection Time: 05/16/16  3:40 PM  Result Value Ref Range   Color, Urine AMBER (A) YELLOW    Comment: BIOCHEMICALS MAY BE AFFECTED BY COLOR   APPearance HAZY (A) CLEAR   Specific Gravity, Urine 1.018 1.005 - 1.030   pH 6.0 5.0 - 8.0   Glucose, UA 50 (A) NEGATIVE mg/dL   Hgb urine dipstick SMALL (A) NEGATIVE   Bilirubin Urine NEGATIVE NEGATIVE   Ketones, ur NEGATIVE NEGATIVE mg/dL   Protein, ur >=300 (A) NEGATIVE mg/dL   Nitrite POSITIVE (A) NEGATIVE   Leukocytes, UA NEGATIVE NEGATIVE   RBC / HPF 0-5 0 - 5 RBC/hpf   WBC, UA 0-5 0 - 5 WBC/hpf   Bacteria, UA FEW (A) NONE SEEN   Mucous PRESENT     Studies/Results:     REPEAT MRI 05-17-16 FINDINGS: Brain: In comparison with prior MRI of the brain there are multiple new foci of diffusion restriction most pronounced in the right superior parietal lobe and the bilateral occipital lobes, but new foci are present throughout the hemispheres bilaterally. Foci of diffusion restriction are associated with T2 FLAIR hyperintensity. Additionally there is a background of advanced chronic microvascular ischemic change and parenchymal volume loss of  the brain. There are several small chronic lacunar infarcts in the cerebellar hemispheres bilaterally  and within the right basal ganglia extending into corona radiata.  Stable subcentimeter subacute hematoma in the right posterior frontal region. Few additional punctate foci of susceptibility hypointensity are present in the brain for example in the left posterior temporal lobe compatible sequelae of old hemorrhage. No new susceptibility hypointensity is identified to suggest interval intracranial hemorrhage.  No extra-axial collection or hydrocephalus.  Vascular: Central flow voids are maintained.  Skull and upper cervical spine: Normal marrow signal.  Sinuses/Orbits: Negative.  Other: None.  IMPRESSION: 1. Interval development of multiple new foci of diffusion restriction throughout the brain compatible with new acute infarcts. New infarcts are in greatest concentration within the left high parietal lobe and bilateral occipital lobes. The pattern is most consistent with new cerebral emboli. 2. Stable subacute hemorrhage in the right posterior frontal region. No new hemorrhage identified.       REPEAT HEAD CT 1- 7- 18 FINDINGS: Brain: Stable 9 mm hyperdense acute right intracranial hemorrhage in the posterior right frontal lobe at the gray-white matter junction as before. No significant interval change.  Stable background atrophy and chronic white matter microvascular ischemic changes. Remote right basal ganglia infarcts with mild ex vacuo dilatation of the right lateral ventricle. No significant mass effect, midline shift, herniation, hydrocephalus, or extra-axial fluid collection. Cisterns remain patent. Cerebellar atrophy as well.  Vascular: Intracranial atherosclerosis noted.  Skull: Normal. Negative for fracture or focal lesion.  Sinuses/Orbits: No acute finding.  Other: None.  IMPRESSION: Stable 9 mm acute subcortical intracranial  hemorrhage in the posterior right frontal lobe.  Other chronic findings as above. No significant interval change compared 05/11/2016.       TTE - Left ventricle: The cavity size was normal. Wall thickness was   increased in a pattern of mild LVH. Systolic function was normal.   The estimated ejection fraction was in the range of 50% to 55%.   Wall motion was normal; there were no regional wall motion   abnormalities. Doppler parameters are consistent with abnormal   left ventricular relaxation (grade 1 diastolic dysfunction). - Aortic valve: There was moderate regurgitation. Valve area (VTI):   2.56 cm^2. Valve area (Vmax): 2.6 cm^2. Valve area (Vmean): 2.11   cm^2. - Left atrium: The atrium was mildly dilated. - Atrial septum: No defect or patent foramen ovale was identified. - Technically adequate study.   Nea Gittens A. Merlene Laughter, M.D.  Diplomate, Tax adviser of Psychiatry and Neurology ( Neurology). 05/17/2016, 6:14 PM

## 2016-05-17 NOTE — Progress Notes (Signed)
PROGRESS NOTE    Ronald Bruce  ZOX:[id number] DOB: 04/02/1936 DOA: 05/11/2016 PCP: Dwana MelenaZack Hall, MD   Brief Narrative: 81 year old male with history of prior stroke with residual left-sided hemiparesis, presented with generalized weakness and frequent falls not eating much. In the ER CT scan of head showed a small intracranial hemorrhage. MRI of the brain showed numerous small areas of acute cerebral infarction throughout both cerebral hemispheres and cerebellum bilateral. Also with positive troponin. Neurologist and cardiologist evaluated the patient.  Assessment & Plan:   Principal Problem:   Intracranial bleeding (HCC) Active Problems:   Dehydration   Elevated troponin   Stroke (cerebrum) (HCC)   Hypokalemia   FTT (failure to thrive) in adult   Protein-calorie malnutrition, severe   Palliative care encounter   Goals of care, counseling/discussion   DNR (do not resuscitate) discussion  # Acute cerebral infarction, multiple small areas of ischemia in bilateral cerebral hemispheres and cerebellum likely thrombo- embolic etiology. Also has acute hemorrhagic stroke at right parietal white matter: -MRI MRA : Multiple strokes and intracranial atherosclerotic disease. No large vessel occlusion noticed -Carotid ultrasound result reviewed. No significant stenosis. -PT, OT and social worker evaluation. Patient will be discharged to nursing facility for rehabilitation on discharge.  -Currently on Lipitor 40 mg. Serum LDL level is 73 -Unable to give aspirin or anticoagulation because of intracranial hemorrhage. Discussed with the neurologist and cardiologist. -Echocardiogram with normal systolic function, grade 1 diastolic dysfunction. -Discussed with the cardiologist, Dr. Wyline MoodBranch on 05/13/2016, he will arrange for outpatient cardiac monitoring and possibly TEE. -Patient with worsening mental status therefore reevaluated by neurologist recommended EEG and MRI brain.  # Acute change in mental  status, possibly due to acute stroke versus acute delirium: -Patient with gradual decline in cognition function. He was able to ambulate and talk on admission although he had cognition impairment before admission. He was more alert but is still not following commands and not oriented today. Repeat CT scan of head showed no acute finding. Reevaluated by Dr. Gerilyn Pilgrimoonquah, I discussed with him today as well. He recommended EEG and MRI of the brain.  -Reportedly patient has foul-smelling urine. I'm concerned if infection might have worsened his mental status. Started IV ceftriaxone. Follow up culture results. -Cognition and swallowing evaluation by the swallow team. I discussed with them. Patient is currently nothing by mouth with only ice chips. We will continue to monitor. I discussed with the patient's sister at bedside at length regarding further care. If patient is not able to eat, probably comfort care and hospice care will be the next option. After I discussed with the patient's sister, not willing to do more invasive procedures including feeding tube placement. Emotional support provided to the patient's sister.  -I will continue IV fluid. Pepcid discontinued by neurologist. -Ordered MRI of the brain and low-dose Ativan before the procedure for agitation.  -Palliative care consult appreciated. Patient is now DNR/DNI.  #Elevated troponin level/NSTEMI: Peak troponin level of 1.4. Evaluated by cardiologist. Patient does not have any specific cardiopulmonary symptoms this time. He had generalized weakness and failure to thrive. EKG showed inferior and anterolateral Q waves. Unable to give aspirin or anticoagulation because of intracranial hemorrhage. Echo showed no regional wall motion abnormalities.  #Hypertension: Blood pressure elevated. Because of nothing by mouth he is not taking Norvasc, Avapro,  hydralazine and Bystolic today. I started hydralazine 10 mg every 6-8 hourly schedule. Monitor blood  pressure. May need beta blocker depending on heart rate and blood pressure. -Avoid hypotensive  episode.  #Hypokalemia: Monitor labs. Encourage oral intake.  # Failure to thrive and possibly mild protein calorie malnutrition: speech and swallow evaluation ongoing, appreciated. Patient will be NPO today. Continue to monitor mental status.  DVT prophylaxis: SCD . Code Status: Full code Family Communication: Patient's sister at bedside. During this hospitalization I have discussed with multiple family members with the patient's. Disposition Plan: Likely discharge to SNF in 1-2 days, may go with hospice care depending on further test results.  Consultants:   Neurologist and cardiologist  Procedures: MRI and MRA brain, carotid ultrasound. Echo  Antimicrobials: Ceftriaxone he started on January 8.  Subjective: Patient was seen and examined at bedside. She was allowed awake but not following commands. Has intermittent agitation. Not oriented. Unable to obtain review of systems the patient.  Objective: Vitals:   05/16/16 1740 05/16/16 2140 05/17/16 0140 05/17/16 0540  BP: (!) 158/71 (!) 146/81 (!) 170/81 (!) 169/77  Pulse: 71 81 81 (!) 8  Resp: 18 18 18 18   Temp: 97.9 F (36.6 C) 97.7 F (36.5 C) 97.7 F (36.5 C) 97.7 F (36.5 C)  TempSrc:  Axillary Axillary Axillary  SpO2: 98% 97% 96% 96%  Weight:      Height:        Intake/Output Summary (Last 24 hours) at 05/17/16 1345 Last data filed at 05/17/16 0830  Gross per 24 hour  Intake             1965 ml  Output              900 ml  Net             1065 ml   Filed Weights   05/11/16 1624 05/11/16 2332  Weight: 89.8 kg (198 lb) 70.2 kg (154 lb 11.2 oz)    Examination:  General exam: Alert, awake, not in distress.Marland Kitchen Respiratory system: There are bilateral, no wheezing or crackle. Cardiovascular system: Regular rate rhythm, S1-S2 normal. No pedal edema..  Gastrointestinal system: Bowel sound positive, soft, nontender.  Nondistended. Central nervous system: Not following commands and not oriented.. Extremities: No cyanosis or edema.  Skin: No rashes, lesions or ulcers Psychiatry: Unable to assess.     Data Reviewed: I have personally reviewed following labs and imaging studies  CBC:  Recent Labs Lab 05/11/16 1704 05/16/16 1446  WBC 7.2 7.2  HGB 15.4 13.8  HCT 46.6 42.6  MCV 92.6 91.4  PLT 104* 51*   Basic Metabolic Panel:  Recent Labs Lab 05/11/16 1704 05/12/16 0544 05/12/16 1120 05/13/16 0632 05/14/16 0700 05/16/16 1446  NA 142 142  --  144 143 145  K 3.0* 2.9* 3.2* 3.3* 3.4* 3.4*  CL 107 109  --  112* 114* 115*  CO2 26 26  --  25 25 24   GLUCOSE 167* 161*  --  181* 161* 178*  BUN 26* 21*  --  16 13 17   CREATININE 1.06 0.77  --  0.87 0.82 1.00  CALCIUM 9.2 8.6*  --  8.4* 8.3* 8.1*  MG  --   --   --  1.7 2.0  --    GFR: Estimated Creatinine Clearance: 60.5 mL/min (by C-G formula based on SCr of 1 mg/dL). Liver Function Tests:  Recent Labs Lab 05/11/16 1704  AST 22  ALT 8*  ALKPHOS 367*  BILITOT 0.7  PROT 7.0  ALBUMIN 3.6   No results for input(s): LIPASE, AMYLASE in the last 168 hours.  Recent Labs Lab 05/11/16 1927  AMMONIA 23  Coagulation Profile: No results for input(s): INR, PROTIME in the last 168 hours. Cardiac Enzymes:  Recent Labs Lab 05/11/16 1827 05/11/16 2349 05/12/16 0544 05/12/16 1120 05/13/16 0632  CKTOTAL 146  --   --   --   --   TROPONINI 1.32* 1.31* 1.20* 1.13* 1.44*   BNP (last 3 results) No results for input(s): PROBNP in the last 8760 hours. HbA1C: No results for input(s): HGBA1C in the last 72 hours. CBG:  Recent Labs Lab 05/11/16 1643  GLUCAP 156*   Lipid Profile: No results for input(s): CHOL, HDL, LDLCALC, TRIG, CHOLHDL, LDLDIRECT in the last 72 hours. Thyroid Function Tests: No results for input(s): TSH, T4TOTAL, FREET4, T3FREE, THYROIDAB in the last 72 hours. Anemia Panel: No results for input(s): VITAMINB12,  FOLATE, FERRITIN, TIBC, IRON, RETICCTPCT in the last 72 hours. Sepsis Labs: No results for input(s): PROCALCITON, LATICACIDVEN in the last 168 hours.  No results found for this or any previous visit (from the past 240 hour(s)).       Radiology Studies: Ct Head Wo Contrast  Result Date: 05/15/2016 CLINICAL DATA:  Aphasic, intracranial hemorrhage EXAM: CT HEAD WITHOUT CONTRAST TECHNIQUE: Contiguous axial images were obtained from the base of the skull through the vertex without intravenous contrast. COMPARISON:  05/11/2016 FINDINGS: Brain: Stable 9 mm hyperdense acute right intracranial hemorrhage in the posterior right frontal lobe at the gray-white matter junction as before. No significant interval change. Stable background atrophy and chronic white matter microvascular ischemic changes. Remote right basal ganglia infarcts with mild ex vacuo dilatation of the right lateral ventricle. No significant mass effect, midline shift, herniation, hydrocephalus, or extra-axial fluid collection. Cisterns remain patent. Cerebellar atrophy as well. Vascular: Intracranial atherosclerosis noted. Skull: Normal. Negative for fracture or focal lesion. Sinuses/Orbits: No acute finding. Other: None. IMPRESSION: Stable 9 mm acute subcortical intracranial hemorrhage in the posterior right frontal lobe. Other chronic findings as above. No significant interval change compared 05/11/2016. Electronically Signed   By: Judie Petit.  Shick M.D.   On: 05/15/2016 14:36        Scheduled Meds: .  stroke: mapping our early stages of recovery book   Does not apply Once  . amLODipine  10 mg Oral Daily   And  . irbesartan  300 mg Oral Daily  . atorvastatin  40 mg Oral q1800  . cefTRIAXone (ROCEPHIN)  IV  1 g Intravenous Q24H  . cyanocobalamin  1,000 mcg Intramuscular 1 day or 1 dose  . docusate  200 mg Oral BID  . escitalopram  20 mg Oral Daily  . feeding supplement (ENSURE ENLIVE)  237 mL Oral BID BM  . hydrALAZINE  10 mg  Intravenous Q8H  . hydrALAZINE  10 mg Oral Q8H  . LORazepam  0.5 mg Intravenous UD  . multivitamin with minerals  1 tablet Oral Daily  . nebivolol  10 mg Oral Daily  . potassium chloride  40 mEq Oral Daily  . QUEtiapine  50 mg Oral QHS  . sodium chloride flush  3 mL Intravenous Q12H  . tamsulosin  0.4 mg Oral Daily   Continuous Infusions: . dextrose 5 % and 0.9 % NaCl with KCl 20 mEq/L 100 mL/hr at 05/17/16 0736     LOS: 6 days    Arlynn Mcdermid Jaynie Collins, MD [facility] Pager [phone]  If 7PM-7AM, please contact night-coverage [email or web address] Password [email or web address] 05/17/2016, 1:45 PM

## 2016-05-17 NOTE — Progress Notes (Addendum)
Patient admitted with multiple acute ischemic and hemorrhagic stroke.  MRI result reviewed, consistent with new stroke.  Left message to Dr. Gerilyn Pilgrimoonquah who is following from neurology  -unable to order aspirin or anticoagulation due to hemorrhage.  -follow up neurologist recommendation.  -poor prognosis, continue supportive care.  Addendum 6:18 Pm: Spoke with Dr. Gerilyn Pilgrimoonquah regarding the test result. Limited treatment options.  Check CBC to monitor thrombocytopenia.  He will re-evaluate the patient for aspirin use.

## 2016-05-17 NOTE — Progress Notes (Signed)
EEG Completed; Results Pending  

## 2016-05-17 NOTE — Progress Notes (Signed)
Physical Therapy Treatment Patient Details Name: Ronald Bruce MRN: [id number] DOB: Aug 29, 1935 Today's Date: 05/17/2016    History of Present Illness Ronald Bruce is an 81 y.o. 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  Work up in the ER included a head CT which showed a small intracebral hemorrhage, appeared not acute, along prior stroke.  He was found to have a troponin of 1.32.  EKG showed T wave inversion on precordial leads, but no acute changes.  He also has a K of 3.0, normal renal fx test, normal NH3, and a clear CXR, with fx rib.  Neurology was called, who felt that his intracranial blood is likely old.  Hospitalist was asked to admit him for further work up of intracranial bleed, elevated troponin, and failure to thrive.   Dx: NSTEMI, and CVA -possible embolic with evidence of hemorrhagic conversion.    PT Comments    Pt supine in bed and willing to participate.  Pt yelled "yes", "no" and "hey" through session though was not answering questions.  Max A for transfer supine to sit and max A to sit by EOB per posterior lean.  Upon sitting by c/o increased LBP and rib pain though was unable to given pain scale varied from 0/10 to 5/10.  Due to max A required for safety no transfer complete this session for safety.  Pt left supine in bed with call bell within reach and family member in room.    Follow Up Recommendations        Equipment Recommendations       Recommendations for Other Services       Precautions / Restrictions Precautions Precautions: Fall Restrictions Weight Bearing Restrictions: No    Mobility  Bed Mobility Overal bed mobility: Needs Assistance Bed Mobility: Supine to Sit     Supine to sit: Max assist;HOB elevated     General bed mobility comments: Max A with supine >--sit and Max A to  sit by EOB, several cueing unsuccessful with handplacement for A, unable to follow command  Transfers                    Ambulation/Gait                 Stairs            Wheelchair Mobility    Modified Rankin (Stroke Patients Only)       Balance                                    Cognition Arousal/Alertness: Awake/alert Behavior During Therapy: Agitated;Anxious;Impulsive Overall Cognitive Status:  (h/o previous CVA, family states cog)   Orientation Level: Disoriented to;Place;Time;Situation                  Exercises      General Comments        Pertinent Vitals/Pain Pain Assessment:  (Various range from 0-5/10 LBP)    Home Living                      Prior Function            PT Goals (current goals can now be found in the care plan section)  Frequency           PT Plan      Co-evaluation             End of Session   Activity Tolerance: Patient limited by fatigue;Patient tolerated treatment well Patient left: in bed;with bed alarm set;with family/visitor present     Time: 1191-47821302-1335 PT Time Calculation (min) (ACUTE ONLY): 33 min  Charges:  $Therapeutic Activity: 23-37 mins                    G Codes:     Ronald Bruce, LPTA; CBIS [phone]  Ronald Bruce 05/17/2016, 2:31 PM

## 2016-05-18 DIAGNOSIS — I629 Nontraumatic intracranial hemorrhage, unspecified: Secondary | ICD-10-CM

## 2016-05-18 DIAGNOSIS — Z7189 Other specified counseling: Secondary | ICD-10-CM

## 2016-05-18 LAB — C-REACTIVE PROTEIN: CRP: 9.6 mg/dL — ABNORMAL HIGH (ref <1.0)

## 2016-05-18 LAB — SEDIMENTATION RATE: SED RATE: 18 mm/h — AB (ref 0–16)

## 2016-05-18 MED ORDER — SODIUM CHLORIDE 0.9 % IV SOLN
1000.0000 mg | Freq: Every day | INTRAVENOUS | Status: DC
  Administered 2016-05-18 – 2016-05-22 (×5): 1000 mg via INTRAVENOUS
  Filled 2016-05-18 (×6): qty 8

## 2016-05-18 NOTE — Progress Notes (Signed)
Speech Language Pathology Treatment: Dysphagia;Cognitive-Linquistic  Patient Details Name: Ronald Bruce MRN: [id number] DOB: 04/13/1936 Today's Date: 05/18/2016 Time: [id number] SLP Time Calculation (min) (ACUTE ONLY): 45 min  Assessment / Plan / Recommendation Clinical Impression  Pt was alert throughout today's visit. He was seen for cognitive and dysphagia treatment today (11:40-12:25). His caregiver "Toma CopierSherie" was present during most of session. He labeled "cup" and "spoon" today with min cues during oral care and po trials. He followed SLP commands (open mouth, cough)  with max models and verbal prompting-noted weak throat clear and cough today. He was not oriented to location but he answered appropriately in response to hearing his name "huh." Noted biting and briefly clamping down on  oral care swab and spoon with all trials today, eventually releasing with cues to "let go" (using calm voice). He consumed NTL via spoon with no s/sx of aspiration in 5 trials noted minimal delayed cough post swallow with puree in 1 of 5 trials. Pt independently used tongue to remove residue remaining outside oral cavity x2. Educated caregiver re: need to continue with puree and NTL due to aspiration risk, as she had questions about how to puree his food at home. Caregiver stating that pt spoke with her before hospitalization ("when he was well') and stated  that he doesn't want to go to a "nursing home" and that she would like to take him home - deferred to case management [name]([name]). He attended to po trials with max cueing and redirection, as noted yesterday he responds well to a calm voice. Educated caregiver about compensatory strategies for swallowing, and she verbally demonstrated understanding. Pt continued to have verbal outbursts today, and was redirected with a calm voice. Followed up with nsg about all recommendations. Plan to continue ST care plan and follow while on acute, as he is at  risk of aspiration and has  altered cognition.    HPI HPI: Pt is a 81 year old male with history of prior stroke with residual left-sided hemiparesis, presented with generalized weakness and frequent falls not eating much. In the ER CT scan of head showed a small intracranial hemorrhage. MRI of the brain showed numerous small areas of acute cerebral infarction throughout both cerebral hemispheres and cerebellum bilateral. Also with positive troponin. Neurologist and cardiologist evaluated the patient.      SLP Plan  Continue with current plan of care     Recommendations  Diet recommendations: Dysphagia 1 (puree);Nectar-thick liquid Liquids provided via: No straw;Teaspoon (no plastic or styrofoam cups ) Medication Administration: Other (Comment) (follow with bites of ice cream ) Supervision: Staff to assist with self feeding;Full supervision/cueing for compensatory strategies Compensations: Minimize environmental distractions;Slow rate;Monitor for anterior loss Postural Changes and/or Swallow Maneuvers: Seated upright 90 degrees;Upright 30-60 min after meal                Oral Care Recommendations: Oral care BID;Oral care prior to ice chip/H20;Oral care before and after PO;Staff/trained caregiver to provide oral care Follow up Recommendations: Skilled Nursing facility Plan: Continue with current plan of care                Thank you,   Danella Maiersshley M. Orvan Falconerampbell, MS, [name]  Speech-Language Pathologist [phone]          Addison Naegelishley Celicia Minahan 05/18/2016, 3:07 PM

## 2016-05-18 NOTE — Progress Notes (Addendum)
[facility] Sheri Prows A. Gerilyn Pilgrim, MD     [email or web address]          Ronald Bruce is an 81 y.o. male.   ASSESSMENT/PLAN:      UPDATE 05-18-16 Overall it appears that the patient is unchanged. He continues to have some confusion. He is swallowing better but seem not to be eating enough to sustain adequate nutrition. He still is confused and agitated but this has improved. The patient has been started on high-dose Solu-Medrol as I'm concerned that he could have vasculitis as the main cause of his progressive multiple bilateral strokes. His C-reactive protein is also quite high. He will be given at least a 3 day and possibly a five-day course of high steroids and we'll see how he progresses.   UPDATE 05-17-16 The family is pleased with the progress overnight. He appears to be less confused and much less agitated today. He has been able to swallow and his diet has been upgraded from nothing by mouth  to soft diet. The report that he is much less combative. Unfortunately, the MRI results show multiple new small infarcts bilaterally. This is undoubtedly cardio embolic phenomena. However, given the patient's thrombocytopenia and small intracranial hemorrhage, I do not think that we can start the patient on anticoagulation. The case discussed at length with the family. We will repeat a CBC and may consider adding aspirin if his play constant have improved. I did discuss with the daughter to consider hospice versus long-term nursing home placement. Given the multiple infarcts, think his long-term prognosis for functional outcome is likely poor.    UPDATE 05-16-16 The patient has been quite confused and agitated over the weekend. He has been pulling at things and hitting after staff. The family has been concerned. Extensive discussions were done with the family. He has been started on Pepcid as he only new medications. Repeat imaging shows essentially no significant changes. Repeat labs are also  unrevealing. There reports that he has not had significant cognitive impairment dementia before that he did have some short-term memory impairment at baseline. I will discontinue the Pepcid. EEG will be obtained. Baseline oximetry on room air will also be done. Because of the agitation seroquel will be added. If the above workup is unrevealing, repeat MRI will be done although he will most likely need to be sedated.      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 The etiology is most likely due to acute MI with embolic phenomena or atrial fibrillation. Valve disease is also an issue. The patient is to be kept off antiplatelet agents or anticoagulation for the next month.    Suspect the patient likely also has mild to moderate cognitive impairment at baseline. The patient most likely has vascular dementia.    Thrombocytopenia.  Vitamin B12 deficiency. Replacement will be initiated.   Hyperglycemia.         GENERAL: Pleasant average weight male in no acute distress.  HEENT: Supple. Atraumatic normocephalic.   ABDOMEN: soft  EXTREMITIES: No edema   BACK: Normal.  SKIN: Normal by inspection.    MENTAL STATUS:  The patient is sleeping but arousable to light sternal rub and much less agitated and combative. He states that he is doing okay and raises his right hand to command.  CRANIAL NERVES: Pupils are - right pupil 5 mm and left 6 both are reactive; extra ocular movements are full, there is no significant nystagmus; visual fields are full; upper and lower facial muscles are normal in  strength and symmetric, there is no flattening of the nasolabial folds; tongue is midline; uvula is midline; shoulder elevation is normal.  MOTOR: Patient has spasticity and contracture of the left upper extremity. The wife reports that he has had a stroke on the left side. He moves the right side decently and at least against gravity. He does follow commands by election of  the right upper extremity.  COORDINATION: Left finger to nose is normal, right finger to nose is normal, No rest tremor; no intention tremor; no postural tremor; no bradykinesia.  SENSATION: He responds to painful stem light bilaterally.    The repeat MRI is reviewed in person shows multiple new acute infarcts over the previous infarcts below their mostly seen in the cerebral hemispheres bilaterally. There is seen in the left posterior frontal, right frontal and right parietal areas.      Blood pressure (!) 145/70, pulse 84, temperature 97.3 F (36.3 C), temperature source Oral, resp. rate 16, height 6' (1.829 m), weight 154 lb 11.2 oz (70.2 kg), SpO2 93 %.  Past Medical History:  Diagnosis Date  . Hypertension   . Stroke [facility])     History reviewed. No pertinent surgical history.  Family History  Problem Relation Age of Onset  . Hypertension Mother   . Pneumonia Mother   . Lung cancer Father     Social History:  reports that he has never smoked. He has never used smokeless tobacco. He reports that he drinks about 2.4 - 3.0 oz of alcohol per week . He reports that he does not use drugs.  Allergies: No Known Allergies  Medications: Prior to Admission medications   Medication Sig Start Date End Date Taking? Authorizing Provider  ALPRAZolam Prudy Feeler) 1 MG tablet Take 1 mg by mouth 3 (three) times daily as needed for anxiety.   Yes Historical Provider, MD  amLODipine-valsartan (EXFORGE) 10-320 MG tablet Take 1 tablet by mouth daily.   Yes Historical Provider, MD  cholecalciferol (VITAMIN D) 1000 units tablet Take 1,000 Units by mouth daily.   Yes Historical Provider, MD  clopidogrel (PLAVIX) 75 MG tablet Take 75 mg by mouth daily. 05/01/16  Yes Historical Provider, MD  escitalopram (LEXAPRO) 20 MG tablet Take 20 mg by mouth daily.   Yes Historical Provider, MD  nebivolol (BYSTOLIC) 10 MG tablet Take 10 mg by mouth daily.   Yes Historical Provider, MD  tamsulosin (FLOMAX) 0.4 MG  CAPS capsule Take 0.4 mg by mouth daily.   Yes Historical Provider, MD    Scheduled Meds: .  stroke: mapping our early stages of recovery book   Does not apply Once  . amLODipine  10 mg Oral Daily   And  . irbesartan  300 mg Oral Daily  . atorvastatin  40 mg Oral q1800  . cefTRIAXone (ROCEPHIN)  IV  1 g Intravenous Q24H  . docusate  200 mg Oral BID  . escitalopram  20 mg Oral Daily  . feeding supplement (ENSURE ENLIVE)  237 mL Oral BID BM  . hydrALAZINE  10 mg Intravenous Q8H  . methylPREDNISolone (SOLU-MEDROL) injection  1,000 mg Intravenous Daily  . multivitamin with minerals  1 tablet Oral Daily  . nebivolol  10 mg Oral Daily  . potassium chloride  40 mEq Oral Daily  . QUEtiapine  50 mg Oral QHS  . sodium chloride flush  3 mL Intravenous Q12H  . tamsulosin  0.4 mg Oral Daily   Continuous Infusions: . dextrose 5 % and 0.9 % NaCl  with KCl 20 mEq/L 100 mL/hr at 05/17/16 0736   PRN Meds:.haloperidol lactate, RESOURCE THICKENUP CLEAR     Results for orders placed or performed during the hospital encounter of 05/11/16 (from the past 48 hour(s))  CBC     Status: Abnormal   Collection Time: 05/17/16  8:03 PM  Result Value Ref Range   WBC 7.2 4.0 - 10.5 K/uL   RBC 4.51 4.22 - 5.81 MIL/uL   Hemoglobin 13.6 13.0 - 17.0 g/dL   HCT 40.940.9 81.139.0 - 91.452.0 %   MCV 90.7 78.0 - 100.0 fL   MCH 30.2 26.0 - 34.0 pg   MCHC 33.3 30.0 - 36.0 g/dL   RDW 78.213.1 95.611.5 - 21.315.5 %   Platelets 57 (L) 150 - 400 K/uL    Comment: SPECIMEN CHECKED FOR CLOTS CONSISTENT WITH PREVIOUS RESULT   C-reactive protein     Status: Abnormal   Collection Time: 05/18/16  8:46 AM  Result Value Ref Range   CRP 9.6 (H) <1.0 mg/dL    Comment: [facility]  Sedimentation rate     Status: Abnormal   Collection Time: 05/18/16  8:46 AM  Result Value Ref Range   Sed Rate 18 (H) 0 - 16 mm/hr    Studies/Results:     REPEAT MRI 05-17-16 FINDINGS: Brain: In comparison with prior MRI of the brain there  are multiple new foci of diffusion restriction most pronounced in the right superior parietal lobe and the bilateral occipital lobes, but new foci are present throughout the hemispheres bilaterally. Foci of diffusion restriction are associated with T2 FLAIR hyperintensity. Additionally there is a background of advanced chronic microvascular ischemic change and parenchymal volume loss of the brain. There are several small chronic lacunar infarcts in the cerebellar hemispheres bilaterally and within the right basal ganglia extending into corona radiata.  Stable subcentimeter subacute hematoma in the right posterior frontal region. Few additional punctate foci of susceptibility hypointensity are present in the brain for example in the left posterior temporal lobe compatible sequelae of old hemorrhage. No new susceptibility hypointensity is identified to suggest interval intracranial hemorrhage.  No extra-axial collection or hydrocephalus.  Vascular: Central flow voids are maintained.  Skull and upper cervical spine: Normal marrow signal.  Sinuses/Orbits: Negative.  Other: None.  IMPRESSION: 1. Interval development of multiple new foci of diffusion restriction throughout the brain compatible with new acute infarcts. New infarcts are in greatest concentration within the left high parietal lobe and bilateral occipital lobes. The pattern is most consistent with new cerebral emboli. 2. Stable subacute hemorrhage in the right posterior frontal region. No new hemorrhage identified.       REPEAT HEAD CT 1- 7- 18 FINDINGS: Brain: Stable 9 mm hyperdense acute right intracranial hemorrhage in the posterior right frontal lobe at the gray-white matter junction as before. No significant interval change.  Stable background atrophy and chronic white matter microvascular ischemic changes. Remote right basal ganglia infarcts with mild ex vacuo dilatation of the right lateral  ventricle. No significant mass effect, midline shift, herniation, hydrocephalus, or extra-axial fluid collection. Cisterns remain patent. Cerebellar atrophy as well.  Vascular: Intracranial atherosclerosis noted.  Skull: Normal. Negative for fracture or focal lesion.  Sinuses/Orbits: No acute finding.  Other: None.  IMPRESSION: Stable 9 mm acute subcortical intracranial hemorrhage in the posterior right frontal lobe.  Other chronic findings as above. No significant interval change compared 05/11/2016.       TTE - Left ventricle: The cavity size was normal.  Wall thickness was   increased in a pattern of mild LVH. Systolic function was normal.   The estimated ejection fraction was in the range of 50% to 55%.   Wall motion was normal; there were no regional wall motion   abnormalities. Doppler parameters are consistent with abnormal   left ventricular relaxation (grade 1 diastolic dysfunction). - Aortic valve: There was moderate regurgitation. Valve area (VTI):   2.56 cm^2. Valve area (Vmax): 2.6 cm^2. Valve area (Vmean): 2.11   cm^2. - Left atrium: The atrium was mildly dilated. - Atrial septum: No defect or patent foramen ovale was identified. - Technically adequate study.   Ellijah Leffel A. Gerilyn Pilgrim, M.D.  Diplomate, Biomedical engineer of Psychiatry and Neurology ( Neurology). 05/18/2016, 5:28 PM

## 2016-05-18 NOTE — Progress Notes (Signed)
Daily Progress Note   Patient Name: Ronald Bruce       Date: 05/18/2016 DOB: [birth year]  Age: 81 y.o. MRN#: [id number] Attending Physician: Ronald Sea, MD Primary Care Physician: Ronald Melena, MD Admit Date: 05/11/2016  Reason for Consultation/Follow-up: Establishing goals of care, Hospice Evaluation and Psychosocial/spiritual support  Subjective: Ronald Bruce is resting in bed with his sister, Ronald Bruce, at bedside. He continues to call out at times, but is able to tell me his name and that we are in the hospital. We talk about trial of high dose IV steroids. We talk about the benefits and responsibilities of hospice at home, we also talk about the benefits of hospice home in [place]. Ronald Bruce states their goal, at this point, would be returned to home with the benefits of hospice.  Length of Stay: 7  Current Medications: Scheduled Meds:  .  stroke: mapping our early stages of recovery book   Does not apply Once  . amLODipine  10 mg Oral Daily   And  . irbesartan  300 mg Oral Daily  . atorvastatin  40 mg Oral q1800  . cefTRIAXone (ROCEPHIN)  IV  1 g Intravenous Q24H  . docusate  200 mg Oral BID  . escitalopram  20 mg Oral Daily  . feeding supplement (ENSURE ENLIVE)  237 mL Oral BID BM  . hydrALAZINE  10 mg Intravenous Q8H  . methylPREDNISolone (SOLU-MEDROL) injection  1,000 mg Intravenous Daily  . multivitamin with minerals  1 tablet Oral Daily  . nebivolol  10 mg Oral Daily  . potassium chloride  40 mEq Oral Daily  . QUEtiapine  50 mg Oral QHS  . sodium chloride flush  3 mL Intravenous Q12H  . tamsulosin  0.4 mg Oral Daily    Continuous Infusions: . dextrose 5 % and 0.9 % NaCl with KCl 20 mEq/L 100 mL/hr at 05/17/16 0736    PRN Meds: haloperidol lactate, RESOURCE THICKENUP  CLEAR  Physical Exam  Constitutional:  Frail, chronically/acutely ill appearing. Calling out at times, but able to answer questions.   HENT:  Head: Normocephalic and atraumatic.  Cardiovascular: Normal rate.   Pulmonary/Chest: Effort normal. No respiratory distress.  Abdominal: Soft. He exhibits no distension.  Musculoskeletal: He exhibits no edema.  Neurological: He is alert.  Calling out at times, but able to  tell me his name and that we are in the hospital.   Skin: Skin is warm and dry.  bruising of arms  Nursing note and vitals reviewed.           Vital Signs: BP (!) 152/82   Pulse 80   Temp 98 F (36.7 C) (Oral)   Resp 16   Ht 6' (1.829 m)   Wt 70.2 kg (154 lb 11.2 oz)   SpO2 96%   BMI 20.98 kg/m  SpO2: SpO2: 96 % O2 Device: O2 Device: Not Delivered O2 Flow Rate:    Intake/output summary:  Intake/Output Summary (Last 24 hours) at 05/18/16 1318 Last data filed at 05/18/16 0800  Gross per 24 hour  Intake              800 ml  Output                0 ml  Net              800 ml   LBM: Last BM Date: 05/14/16 Baseline Weight: Weight: 89.8 kg (198 lb) Most recent weight: Weight: 70.2 kg (154 lb 11.2 oz)       Palliative Assessment/Data:    Flowsheet Rows   Flowsheet Row Most Recent Value  Intake Tab  Referral Department  Hospitalist  Unit at Time of Referral  Med/Surg Unit  Palliative Care Primary Diagnosis  Neurology  Date Notified  05/15/16  Palliative Care Type  New Palliative care  Reason for referral  Clarify Goals of Care  Date of Admission  05/11/16  Date first seen by Palliative Care  05/16/16  # of days Palliative referral response time  1 Day(s)  # of days IP prior to Palliative referral  4  Clinical Assessment  Palliative Performance Scale Score  20%  Pain Max last 24 hours  Not able to report  Pain Min Last 24 hours  Not able to report  Dyspnea Max Last 24 Hours  Not able to report  Dyspnea Min Last 24 hours  Not able to report   Psychosocial & Spiritual Assessment  Palliative Care Outcomes  Patient/Family meeting held?  Yes  Who was at the meeting?  Sister Ronald Bruce, and her daughter Ronald Bruce  Palliative Care Outcomes  Provided advance care planning, Changed CPR status, Provided psychosocial or spiritual support, Clarified goals of care  Patient/Family wishes: Interventions discontinued/not started   Mechanical Ventilation  Palliative Care follow-up planned  -- [Follow-up while at [facility]]      Patient Active Problem List   Diagnosis Date Noted  . Protein-calorie malnutrition, severe 05/16/2016  . Palliative care encounter   . Goals of care, counseling/discussion   . DNR (do not resuscitate) discussion   . FTT (failure to thrive) in adult   . Stroke (cerebrum) (HCC)   . Hypokalemia   . Intracranial bleeding (HCC) 05/11/2016  . Dehydration 05/11/2016  . Elevated troponin 05/11/2016    Palliative Care Assessment & Plan   Patient Profile: 81 y.o.malewith past medical history of stroke with slight left hemiplegia BPH, likely hypertension based on medication listadmitted on 1/3/2018with intracranial bleeding.   Assessment: Intracranial hemorrhage; stable at this time, trial of high dose steroids. no interventions. Increased troponins; unable to take aspirin or anti-platelets at this time due to intracerebral hemorrhage. Failure to thrive in adult; functional decline over the last several months. Continued declines in hospital, dx of home with hospice.   Recommendations/Plan:  Trial of high dose steroids, likely  home with hospice.   Goals of Care and Additional Recommendations:  Limitations on Scope of Treatment: trial of high dose steroids, no CPR, intubation, no PEG tube, likely home with hospice.   Code Status:    Code Status Orders        Start     Ordered   05/16/16 1601  Do not attempt resuscitation (DNR)  Continuous    Question Answer Comment  In the event of cardiac or respiratory  ARREST Do not call a "code blue"   In the event of cardiac or respiratory ARREST Do not perform Intubation, CPR, defibrillation or ACLS   In the event of cardiac or respiratory ARREST Use medication by any route, position, wound care, and other measures to relive pain and suffering. May use oxygen, suction and manual treatment of airway obstruction as needed for comfort.      05/16/16 1601    Code Status History    Date Active Date Inactive Code Status Order ID Comments User Context   05/11/2016 11:37 PM 05/16/2016  4:01 PM Full Code [id number]  Houston Siren, MD Inpatient    Advance Directive Documentation   Flowsheet Row Most Recent Value  Type of Advance Directive  Healthcare Power of Attorney  Pre-existing out of facility DNR order (yellow form or pink MOST form)  No data  "MOST" Form in Place?  No data       Prognosis:   < 3 months or less would not be surprising.  Much less if onset of PNE.   Discharge Planning:  to be determined, likely home with hospice.   Care plan was discussed with nursing staff, CM, SW, and Dr. Thedore Mins.   Thank you for allowing the Palliative Medicine Team to assist in the care of this patient.   Time In: 0945 Time Out: 1015 Total Time 30 minutes Prolonged Time Billed  no       Greater than 50%  of this time was spent counseling and coordinating care related to the above assessment and plan.  Katheran Awe, NP  Please contact Palliative Medicine Team phone at [phone] for questions and concerns.

## 2016-05-18 NOTE — Progress Notes (Signed)
PROGRESS NOTE    Ronald Bruce  HYQ:[id number] DOB: 09/29/1935 DOA: 05/11/2016 PCP: Dwana MelenaZack Hall, MD   Brief Narrative: 81 year old male with history of prior stroke with residual left-sided hemiparesis, presented with generalized weakness and frequent falls not eating much. In the ER CT scan of head showed a small intracranial hemorrhage. MRI of the brain showed numerous small areas of acute cerebral infarction throughout both cerebral hemispheres and cerebellum bilateral. Also with positive troponin. Neurologist and cardiologist evaluated the patient.  Assessment & Plan:   # Acute cerebral infarction, multiple small areas of ischemia in bilateral cerebral hemispheres and cerebellum likely thrombo- embolic etiology. Also has acute hemorrhagic stroke at right parietal white matter: -MRI MRA : Multiple strokes and intracranial atherosclerotic disease. No large vessel occlusion noticed -Carotid ultrasound result reviewed. No significant stenosis. -PT, OT and social worker evaluation. Patient will be discharged to nursing facility for rehabilitation on discharge.  -Currently on Lipitor 40 mg. Serum LDL level is 73 -EEG unremarkable -Unable to give aspirin or anticoagulation because of intracranial hemorrhage. Discussed with the neurologist on 05/18/2016, plan is IV steroid trial to rule out vasculitis if no improvement focus on comfort care/hospice. Also discussed with family bedside who agree. -Echocardiogram with normal systolic function, grade 1 diastolic dysfunction. -Discussed with the cardiologist, Dr. Wyline MoodBranch on 05/13/2016, he will arrange for outpatient cardiac monitoring and possibly TEE. -Patient with worsening mental status MRI repeated on 05/17/2016 shows new multiple infarcts bilateral.  # Acute change in mental status, possibly due to acute stroke versus acute delirium: -Patient with gradual decline in cognition function. He was able to ambulate and talk on admission although he had  cognition impairment before admission. He was more alert but is still not following commands and not oriented today. Repeat CT scan of head showed no acute finding. Reevaluated by Dr. Gerilyn Pilgrimoonquah, I discussed with him today as well. He recommended EEG and MRI of the brain.  -Reportedly patient has foul-smelling urine. I'm concerned if infection might have worsened his mental status. Started IV ceftriaxone. Follow up culture results. -Cognition and swallowing evaluation by the swallow team. I discussed with them. Patient is currently nothing by mouth with only ice chips. We will continue to monitor. I discussed with the patient's sister at bedside at length regarding further care. If patient is not able to eat, probably comfort care and hospice care will be the next option. After I discussed with the patient's sister, not willing to do more invasive procedures including feeding tube placement. Emotional support provided to the patient's sister.  -I will continue IV fluid. Pepcid discontinued by neurologist. -Ordered MRI of the brain and low-dose Ativan before the procedure for agitation.  -Palliative care consult appreciated. Patient is now DNR/DNI.  #Elevated troponin level/NSTEMI: Peak troponin level of 1.4. Evaluated by cardiologist. Patient does not have any specific cardiopulmonary symptoms this time. He had generalized weakness and failure to thrive. EKG showed inferior and anterolateral Q waves. Unable to give aspirin or anticoagulation because of intracranial hemorrhage. Echo showed no regional wall motion abnormalities.  #Hypertension: Blood pressure elevated. Because of nothing by mouth he is not taking Norvasc, Avapro,  hydralazine and Bystolic today. I started hydralazine 10 mg every 6-8 hourly schedule. Monitor blood pressure. May need beta blocker depending on heart rate and blood pressure. -Avoid hypotensive episode.  #Hypokalemia: Monitor labs. Encourage oral intake.  # Failure to thrive  and possibly mild protein calorie malnutrition: speech and swallow evaluation ongoing, appreciated. Patient will be NPO today. Continue  to monitor mental status.  DVT prophylaxis: SCD . Code Status: Full code Family Communication: Patient's sister at bedside. During this hospitalization I have discussed with multiple family members with the patient's. Disposition Plan: Likely discharge to SNF in 1-2 days, may go with hospice care depending on further test results.  Consultants:   Neurologist and cardiologist  Procedures: MRI and MRA brain, EEG, carotid ultrasound. Echo  Antimicrobials: Ceftriaxone   started on January 8.  Subjective: Patient was seen and examined at bedside. He appears to be confused, still able to answer a few basic questions, denies any headache chest or abdominal pain.    Objective: Vitals:   05/17/16 1412 05/17/16 1812 05/17/16 2151 05/17/16 2329  BP: (!) 147/68 129/60 127/62 (!) 152/82  Pulse: 79 77 67 80  Resp: 20 20 20 16   Temp: 97.8 F (36.6 C) 98.3 F (36.8 C) 98 F (36.7 C)   TempSrc: Oral Oral Oral   SpO2: 100% 96% 96%   Weight:      Height:        Intake/Output Summary (Last 24 hours) at 05/18/16 1335 Last data filed at 05/18/16 0800  Gross per 24 hour  Intake              800 ml  Output                0 ml  Net              800 ml   Filed Weights   05/11/16 1624 05/11/16 2332  Weight: 89.8 kg (198 lb) 70.2 kg (154 lb 11.2 oz)    Examination:  General exam: Alert, awake, not in distress.Marland Kitchen Respiratory system: There are bilateral, no wheezing or crackle. Cardiovascular system: Regular rate rhythm, S1-S2 normal. No pedal edema..  Gastrointestinal system: Bowel sound positive, soft, nontender. Nondistended. Central nervous system: Not following commands and not oriented.. Extremities: No cyanosis or edema.  Skin: No rashes, lesions or ulcers Psychiatry: Unable to assess.     Data Reviewed: I have personally reviewed following labs  and imaging studies  CBC:  Recent Labs Lab 05/11/16 1704 05/16/16 1446 05/17/16 2003  WBC 7.2 7.2 7.2  HGB 15.4 13.8 13.6  HCT 46.6 42.6 40.9  MCV 92.6 91.4 90.7  PLT 104* 51* 57*   Basic Metabolic Panel:  Recent Labs Lab 05/11/16 1704 05/12/16 0544 05/12/16 1120 05/13/16 0632 05/14/16 0700 05/16/16 1446  NA 142 142  --  144 143 145  K 3.0* 2.9* 3.2* 3.3* 3.4* 3.4*  CL 107 109  --  112* 114* 115*  CO2 26 26  --  25 25 24   GLUCOSE 167* 161*  --  181* 161* 178*  BUN 26* 21*  --  16 13 17   CREATININE 1.06 0.77  --  0.87 0.82 1.00  CALCIUM 9.2 8.6*  --  8.4* 8.3* 8.1*  MG  --   --   --  1.7 2.0  --    GFR: Estimated Creatinine Clearance: 60.5 mL/min (by C-G formula based on SCr of 1 mg/dL). Liver Function Tests:  Recent Labs Lab 05/11/16 1704  AST 22  ALT 8*  ALKPHOS 367*  BILITOT 0.7  PROT 7.0  ALBUMIN 3.6   No results for input(s): LIPASE, AMYLASE in the last 168 hours.  Recent Labs Lab 05/11/16 1927  AMMONIA 23   Coagulation Profile: No results for input(s): INR, PROTIME in the last 168 hours. Cardiac Enzymes:  Recent Labs Lab 05/11/16 1827 05/11/16  2349 05/12/16 0544 05/12/16 1120 05/13/16 0632  CKTOTAL 146  --   --   --   --   TROPONINI 1.32* 1.31* 1.20* 1.13* 1.44*   BNP (last 3 results) No results for input(s): PROBNP in the last 8760 hours. HbA1C: No results for input(s): HGBA1C in the last 72 hours. CBG:  Recent Labs Lab 05/11/16 1643  GLUCAP 156*   Lipid Profile: No results for input(s): CHOL, HDL, LDLCALC, TRIG, CHOLHDL, LDLDIRECT in the last 72 hours. Thyroid Function Tests: No results for input(s): TSH, T4TOTAL, FREET4, T3FREE, THYROIDAB in the last 72 hours. Anemia Panel: No results for input(s): VITAMINB12, FOLATE, FERRITIN, TIBC, IRON, RETICCTPCT in the last 72 hours. Sepsis Labs: No results for input(s): PROCALCITON, LATICACIDVEN in the last 168 hours.  Recent Results (from the past 240 hour(s))  Culture, Urine      Status: Abnormal (Preliminary result)   Collection Time: 05/16/16  4:44 PM  Result Value Ref Range Status   Specimen Description URINE, CLEAN CATCH  Final   Special Requests NONE  Final   Culture (A)  Final    >=100,000 COLONIES/mL KLEBSIELLA OXYTOCA >=100,000 COLONIES/mL MORGANELLA MORGANII SUSCEPTIBILITIES TO FOLLOW Performed at [facility]    Report Status PENDING  Incomplete         Radiology Studies: Mr Brain 47 Contrast  Result Date: 05/17/2016 CLINICAL DATA:  81 y/o  M; stroke for follow-up. EXAM: MRI HEAD WITHOUT CONTRAST TECHNIQUE: Multiplanar, multiecho pulse sequences of the brain and surrounding structures were obtained without intravenous contrast. COMPARISON:  05/15/2016 CT head.  05/12/2016 MRI head. FINDINGS: Brain: In comparison with prior MRI of the brain there are multiple new foci of diffusion restriction most pronounced in the right superior parietal lobe and the bilateral occipital lobes, but new foci are present throughout the hemispheres bilaterally. Foci of diffusion restriction are associated with T2 FLAIR hyperintensity. Additionally there is a background of advanced chronic microvascular ischemic change and parenchymal volume loss of the brain. There are several small chronic lacunar infarcts in the cerebellar hemispheres bilaterally and within the right basal ganglia extending into corona radiata. Stable subcentimeter subacute hematoma in the right posterior frontal region. Few additional punctate foci of susceptibility hypointensity are present in the brain for example in the left posterior temporal lobe compatible sequelae of old hemorrhage. No new susceptibility hypointensity is identified to suggest interval intracranial hemorrhage. No extra-axial collection or hydrocephalus. Vascular: Central flow voids are maintained. Skull and upper cervical spine: Normal marrow signal. Sinuses/Orbits: Negative. Other: None. IMPRESSION: 1. Interval development of  multiple new foci of diffusion restriction throughout the brain compatible with new acute infarcts. New infarcts are in greatest concentration within the left high parietal lobe and bilateral occipital lobes. The pattern is most consistent with new cerebral emboli. 2. Stable subacute hemorrhage in the right posterior frontal region. No new hemorrhage identified. These results will be called to the ordering clinician or representative by the Radiologist Assistant, and communication documented in the PACS or zVision Dashboard. Electronically Signed   By: Mitzi Hansen M.D.   On: 05/17/2016 18:02        Scheduled Meds: .  stroke: mapping our early stages of recovery book   Does not apply Once  . amLODipine  10 mg Oral Daily   And  . irbesartan  300 mg Oral Daily  . atorvastatin  40 mg Oral q1800  . cefTRIAXone (ROCEPHIN)  IV  1 g Intravenous Q24H  . docusate  200 mg Oral BID  .  escitalopram  20 mg Oral Daily  . feeding supplement (ENSURE ENLIVE)  237 mL Oral BID BM  . hydrALAZINE  10 mg Intravenous Q8H  . methylPREDNISolone (SOLU-MEDROL) injection  1,000 mg Intravenous Daily  . multivitamin with minerals  1 tablet Oral Daily  . nebivolol  10 mg Oral Daily  . potassium chloride  40 mEq Oral Daily  . QUEtiapine  50 mg Oral QHS  . sodium chloride flush  3 mL Intravenous Q12H  . tamsulosin  0.4 mg Oral Daily   Continuous Infusions: . dextrose 5 % and 0.9 % NaCl with KCl 20 mEq/L 100 mL/hr at 05/17/16 0736     LOS: 7 days    Leroy Sea, MD [facility] Pager [phone]  If 7PM-7AM, please contact night-coverage [email or web address] Password [email or web address] 05/18/2016, 1:35 PM

## 2016-05-18 NOTE — Progress Notes (Addendum)
This case is quite unusual with mutiple progressive bilateral infarcts - ??? Vasculits. I will do 3-5 day trial of high dose steroids. Also additional labs.

## 2016-05-19 LAB — BASIC METABOLIC PANEL
Anion gap: 5 (ref 5–15)
BUN: 35 mg/dL — ABNORMAL HIGH (ref 6–20)
CALCIUM: 7.8 mg/dL — AB (ref 8.9–10.3)
CO2: 21 mmol/L — ABNORMAL LOW (ref 22–32)
CREATININE: 1.05 mg/dL (ref 0.61–1.24)
Chloride: 122 mmol/L — ABNORMAL HIGH (ref 101–111)
Glucose, Bld: 229 mg/dL — ABNORMAL HIGH (ref 65–99)
Potassium: 3.7 mmol/L (ref 3.5–5.1)
SODIUM: 148 mmol/L — AB (ref 135–145)

## 2016-05-19 LAB — MAGNESIUM: Magnesium: 1.7 mg/dL (ref 1.7–2.4)

## 2016-05-19 LAB — ANTINUCLEAR ANTIBODIES, IFA: ANTINUCLEAR ANTIBODIES, IFA: NEGATIVE

## 2016-05-19 MED ORDER — HYDROCODONE-ACETAMINOPHEN 5-325 MG PO TABS
1.0000 | ORAL_TABLET | Freq: Four times a day (QID) | ORAL | Status: DC | PRN
  Administered 2016-05-21: 1 via ORAL
  Filled 2016-05-19: qty 1

## 2016-05-19 MED ORDER — MORPHINE SULFATE (PF) 2 MG/ML IV SOLN
1.0000 mg | INTRAVENOUS | Status: DC | PRN
  Administered 2016-05-19: 1 mg via INTRAVENOUS
  Filled 2016-05-19: qty 1

## 2016-05-19 MED ORDER — DEXTROSE 5 % IV SOLN
INTRAVENOUS | Status: DC
  Administered 2016-05-19 – 2016-05-20 (×3): via INTRAVENOUS

## 2016-05-19 NOTE — Progress Notes (Addendum)
[place] A. Merlene Laughter, MD     [email or web address]          Ronald Bruce is an 81 y.o. male.   ASSESSMENT/PLAN:     UPDATE: 05-19-16 The family and hospital staff reported that the patient has improved significantly today. He appears to be more lucid less agitated and the has been talking all day long. The family reports that he is being conversational and making sense throughout the day. He has improved his appetite and has eaten twice today. The speech therapist note is copied below. This improvement seems to be very suggestive of  Primary CNS vasculitis causing multiple progressive strokes. Given the above, I think we should push for the patient to complete 5 days of high-dose Solu-Medrol therapy.    [[[[[[[[[SPEECH THERAPY NOTE Pt will do best with ice cream (COLD) and liquids at this time. Recommend offering Magic Cups, ice cream, cream based soups, and cold NTL presented in hard plastic cup (THIS WAS LEFT IN HIS ROOM). Strongly recommend follow up skilled SLP services at SNF to maximize potential. His dysphagia is primarily cognitively based and he will continue to do better with repetition and good ORAL CARE. He was otherwise extremely pleasant, cooperative, and with a good sense of humor. OT signed off last week when he was doing very well before his change in mental status. PLEASE order OT. He would likely benefit from cotreatment sessions.]]]]]]]]]]]]]     UPDATE 05-18-16 Overall it appears that the patient is unchanged. He continues to have some confusion. He is swallowing better but seem not to be eating enough to sustain adequate nutrition. He still is confused and agitated but this has improved. The patient has been started on high-dose Solu-Medrol as I'm concerned that he could have vasculitis as the main cause of his progressive multiple bilateral strokes. His C-reactive protein is also quite high. He will be given at least a 3 day and possibly a five-day  course of high steroids and we'll see how he progresses.   UPDATE 05-17-16 The family is pleased with the progress overnight. He appears to be less confused and much less agitated today. He has been able to swallow and his diet has been upgraded from nothing by mouth  to soft diet. The report that he is much less combative. Unfortunately, the MRI results show multiple new small infarcts bilaterally. This is undoubtedly cardio embolic phenomena. However, given the patient's thrombocytopenia and small intracranial hemorrhage, I do not think that we can start the patient on anticoagulation. The case discussed at length with the family. We will repeat a CBC and may consider adding aspirin if his play constant have improved. I did discuss with the daughter to consider hospice versus long-term nursing home placement. Given the multiple infarcts, think his long-term prognosis for functional outcome is likely poor.    UPDATE 05-16-16 The patient has been quite confused and agitated over the weekend. He has been pulling at things and hitting after staff. The family has been concerned. Extensive discussions were done with the family. He has been started on Pepcid as he only new medications. Repeat imaging shows essentially no significant changes. Repeat labs are also unrevealing. There reports that he has not had significant cognitive impairment dementia before that he did have some short-term memory impairment at baseline. I will discontinue the Pepcid. EEG will be obtained. Baseline oximetry on room air will also be done. Because of the agitation seroquel will be added. If the above workup is  unrevealing, repeat MRI will be done although he will most likely need to be sedated.      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 The etiology is most likely due to acute MI with embolic phenomena or atrial fibrillation. Valve disease is also an issue. The patient is to be kept off antiplatelet agents  or anticoagulation for the next month.    Suspect the patient likely also has mild to moderate cognitive impairment at baseline. The patient most likely has vascular dementia.    Thrombocytopenia.  Vitamin B12 deficiency. Replacement will be initiated.   Hyperglycemia.         GENERAL: Pleasant average weight male in no acute distress. He is not agitated today although he is a little restless.  HEENT: Supple. Atraumatic normocephalic.   ABDOMEN: soft  EXTREMITIES: No edema   BACK: Normal.  SKIN: Normal by inspection.    MENTAL STATUS:  The patient is awake and alert. He is talking throughout the visit. He is conversational and making sense. 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 He knows that he is in the hospital. He does follow commands.  CRANIAL NERVES: Pupils are - right pupil 5 mm and left 6 both are reactive; extra ocular movements are full, there is no significant nystagmus; visual fields are full; upper and lower facial muscles are normal in strength and symmetric, there is no flattening of the nasolabial folds; tongue is midline; uvula is midline; shoulder elevation is normal.  MOTOR: Patient has spasticity and contracture of the left upper extremity. The wife reports that he has had a stroke on the left side. He moves the right side decently and at least against gravity. He does follow commands by election of the right upper extremity.  COORDINATION: Left finger to nose is normal, right finger to nose is normal, No rest tremor; no intention tremor; no postural tremor; no bradykinesia.  SENSATION: He responds to painful stem light bilaterally.    The repeat MRI is reviewed in person shows multiple new acute infarcts over the previous infarcts below their mostly seen in the cerebral hemispheres bilaterally. There is seen in the left posterior frontal, right frontal and right parietal areas.      Blood pressure  128/71, pulse 74, temperature 98 F (36.7 C), temperature source Oral, resp. rate 16, height 6' (1.829 m), weight 154 lb 11.2 oz (70.2 kg), SpO2 93 %.  Past Medical History:  Diagnosis Date  . Hypertension   . Stroke [facility])     History reviewed. No pertinent surgical history.  Family History  Problem Relation Age of Onset  . Hypertension Mother   . Pneumonia Mother   . Lung cancer Father     Social History:  reports that he has never smoked. He has never used smokeless tobacco. He reports that he drinks about 2.4 - 3.0 oz of alcohol per week . He reports that he does not use drugs.  Allergies: No Known Allergies  Medications: Prior to Admission medications   Medication Sig Start Date End Date Taking? Authorizing Provider  ALPRAZolam Duanne Moron) 1 MG tablet Take 1 mg by mouth 3 (three) times daily as needed for anxiety.   Yes Historical Provider, MD  amLODipine-valsartan (EXFORGE) 10-320 MG tablet Take 1 tablet by mouth daily.   Yes Historical Provider, MD  cholecalciferol (VITAMIN D) 1000 units tablet Take 1,000 Units by mouth daily.   Yes Historical Provider, MD  clopidogrel (PLAVIX) 75 MG tablet Take  75 mg by mouth daily. 05/01/16  Yes Historical Provider, MD  escitalopram (LEXAPRO) 20 MG tablet Take 20 mg by mouth daily.   Yes Historical Provider, MD  nebivolol (BYSTOLIC) 10 MG tablet Take 10 mg by mouth daily.   Yes Historical Provider, MD  tamsulosin (FLOMAX) 0.4 MG CAPS capsule Take 0.4 mg by mouth daily.   Yes Historical Provider, MD    Scheduled Meds: .  stroke: mapping our early stages of recovery book   Does not apply Once  . amLODipine  10 mg Oral Daily   And  . irbesartan  300 mg Oral Daily  . atorvastatin  40 mg Oral q1800  . cefTRIAXone (ROCEPHIN)  IV  1 g Intravenous Q24H  . docusate  200 mg Oral BID  . escitalopram  20 mg Oral Daily  . feeding supplement (ENSURE ENLIVE)  237 mL Oral BID BM  . hydrALAZINE  10 mg Intravenous Q8H  . methylPREDNISolone  (SOLU-MEDROL) injection  1,000 mg Intravenous Daily  . multivitamin with minerals  1 tablet Oral Daily  . nebivolol  10 mg Oral Daily  . potassium chloride  40 mEq Oral Daily  . QUEtiapine  50 mg Oral QHS  . sodium chloride flush  3 mL Intravenous Q12H  . tamsulosin  0.4 mg Oral Daily   Continuous Infusions: . dextrose 110 mL/hr at 05/19/16 1151   PRN Meds:.haloperidol lactate, HYDROcodone-acetaminophen, morphine injection, RESOURCE THICKENUP CLEAR     Results for orders placed or performed during the hospital encounter of 05/11/16 (from the past 48 hour(s))  CBC     Status: Abnormal   Collection Time: 05/17/16  8:03 PM  Result Value Ref Range   WBC 7.2 4.0 - 10.5 K/uL   RBC 4.51 4.22 - 5.81 MIL/uL   Hemoglobin 13.6 13.0 - 17.0 g/dL   HCT 40.9 39.0 - 52.0 %   MCV 90.7 78.0 - 100.0 fL   MCH 30.2 26.0 - 34.0 pg   MCHC 33.3 30.0 - 36.0 g/dL   RDW 13.1 11.5 - 15.5 %   Platelets 57 (L) 150 - 400 K/uL    Comment: SPECIMEN CHECKED FOR CLOTS CONSISTENT WITH PREVIOUS RESULT   ANA, IFA (with reflex)     Status: None   Collection Time: 05/18/16  8:46 AM  Result Value Ref Range   ANA Ab, IFA Negative     Comment: (NOTE)                                     Negative   <1:80                                     Borderline  1:80                                     Positive   >1:80 Performed At: [facility] [place], [place] [id number] Lindon Romp MD [id number]   C-reactive protein     Status: Abnormal   Collection Time: 05/18/16  8:46 AM  Result Value Ref Range   CRP 9.6 (H) <1.0 mg/dL    Comment: Performed at [facility]  Sedimentation rate     Status: Abnormal   Collection Time: 05/18/16  8:46 AM  Result Value Ref Range   Sed Rate 18 (H) 0 - 16 mm/hr  Basic metabolic panel     Status: Abnormal   Collection Time: 05/19/16  6:50 AM  Result Value Ref Range   Sodium 148 (H) 135 - 145 mmol/L   Potassium 3.7 3.5 - 5.1 mmol/L   Chloride 122  (H) 101 - 111 mmol/L   CO2 21 (L) 22 - 32 mmol/L   Glucose, Bld 229 (H) 65 - 99 mg/dL   BUN 35 (H) 6 - 20 mg/dL   Creatinine, Ser 1.05 0.61 - 1.24 mg/dL   Calcium 7.8 (L) 8.9 - 10.3 mg/dL   GFR calc non Af Amer >60 >60 mL/min   GFR calc Af Amer >60 >60 mL/min    Comment: (NOTE) The eGFR has been calculated using the CKD EPI equation. This calculation has not been validated in all clinical situations. eGFR's persistently <60 mL/min signify possible Chronic Kidney Disease.    Anion gap 5 5 - 15  Magnesium     Status: None   Collection Time: 05/19/16  6:50 AM  Result Value Ref Range   Magnesium 1.7 1.7 - 2.4 mg/dL    Studies/Results:     REPEAT MRI 05-17-16 FINDINGS: Brain: In comparison with prior MRI of the brain there are multiple new foci of diffusion restriction most pronounced in the right superior parietal lobe and the bilateral occipital lobes, but new foci are present throughout the hemispheres bilaterally. Foci of diffusion restriction are associated with T2 FLAIR hyperintensity. Additionally there is a background of advanced chronic microvascular ischemic change and parenchymal volume loss of the brain. There are several small chronic lacunar infarcts in the cerebellar hemispheres bilaterally and within the right basal ganglia extending into corona radiata.  Stable subcentimeter subacute hematoma in the right posterior frontal region. Few additional punctate foci of susceptibility hypointensity are present in the brain for example in the left posterior temporal lobe compatible sequelae of old hemorrhage. No new susceptibility hypointensity is identified to suggest interval intracranial hemorrhage.  No extra-axial collection or hydrocephalus.  Vascular: Central flow voids are maintained.  Skull and upper cervical spine: Normal marrow signal.  Sinuses/Orbits: Negative.  Other: None.  IMPRESSION: 1. Interval development of multiple new foci of  diffusion restriction throughout the brain compatible with new acute infarcts. New infarcts are in greatest concentration within the left high parietal lobe and bilateral occipital lobes. The pattern is most consistent with new cerebral emboli. 2. Stable subacute hemorrhage in the right posterior frontal region. No new hemorrhage identified.       REPEAT HEAD CT 1- 7- 18 FINDINGS: Brain: Stable 9 mm hyperdense acute right intracranial hemorrhage in the posterior right frontal lobe at the gray-white matter junction as before. No significant interval change.  Stable background atrophy and chronic white matter microvascular ischemic changes. Remote right basal ganglia infarcts with mild ex vacuo dilatation of the right lateral ventricle. No significant mass effect, midline shift, herniation, hydrocephalus, or extra-axial fluid collection. Cisterns remain patent. Cerebellar atrophy as well.  Vascular: Intracranial atherosclerosis noted.  Skull: Normal. Negative for fracture or focal lesion.  Sinuses/Orbits: No acute finding.  Other: None.  IMPRESSION: Stable 9 mm acute subcortical intracranial hemorrhage in the posterior right frontal lobe.  Other chronic findings as above. No significant interval change compared 05/11/2016.       TTE - Left ventricle: The cavity size was normal. Wall thickness was   increased in a pattern of mild LVH.  Systolic function was normal.   The estimated ejection fraction was in the range of 50% to 55%.   Wall motion was normal; there were no regional wall motion   abnormalities. Doppler parameters are consistent with abnormal   left ventricular relaxation (grade 1 diastolic dysfunction). - Aortic valve: There was moderate regurgitation. Valve area (VTI):   2.56 cm^2. Valve area (Vmax): 2.6 cm^2. Valve area (Vmean): 2.11   cm^2. - Left atrium: The atrium was mildly dilated. - Atrial septum: No defect or patent foramen ovale was  identified. - Technically adequate study.   Kambria Grima A. Merlene Laughter, M.D.  Diplomate, Tax adviser of Psychiatry and Neurology ( Neurology). 05/19/2016, 6:10 PM

## 2016-05-19 NOTE — Progress Notes (Signed)
Physical Therapy Treatment Patient Details Name: Ronald Bruce MRN: [id number] DOB: 08/20/1935 Today's Date: 05/19/2016    History of Present Illness Ronald FolkWayne Bruce is an 81 y.o. 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  Work up in the ER included a head CT which showed a small intracebral hemorrhage, appeared not acute, along prior stroke.  He was found to have a troponin of 1.32.  EKG showed T wave inversion on precordial leads, but no acute changes.  He also has a K of 3.0, normal renal fx test, normal NH3, and a clear CXR, with fx rib.  Neurology was called, who felt that his intracranial blood is likely old.  Hospitalist was asked to admit him for further work up of intracranial bleed, elevated troponin, and failure to thrive.   Dx: NSTEMI, and CVA -possible embolic with evidence of hemorrhagic conversion.  MRI results show multiple new small infarcts bilaterally.      PT Comments    Pt received in bed, brother present, and pt was agreeable to PT tx.  "I would love you if you could help me go fishing." Pt demonstrates increased need for all functional mobility at this time.  He requires Max A +2 for bed mobility, and constant Min/Mod A to maintain static sitting balance.  Worked on trunk control today with improvement in motor planning noted with each repetition.  Increased flexor tone is noted in B LE's, and pt cries out due to discomfort with stretching, however pt was able to achieve near 0* of knee extension with slow stretching and oscillation.  Continue to recommend SNF due to increased amount of assistance he currently requires for all aspects of functional mobility.      Follow Up Recommendations  SNF     Equipment Recommendations  None recommended by PT    Recommendations for Other Services        Precautions / Restrictions Precautions Precautions: Fall Restrictions Weight Bearing Restrictions: No    Mobility  Bed Mobility Overal bed mobility: Needs Assistance Bed Mobility: Supine to Sit;Sit to Supine     Supine to sit: Max assist Sit to supine: Total assist (Pt requires +2 person for supine scoot for positioning in the bed. )   General bed mobility comments: increased time and assistance to advance LE's off the EOB.  Pt calling out with movement of R LE due to increased tone into flexed position.   Transfers Overall transfer level: Needs assistance Equipment used: Rolling walker (2 wheeled) Transfers:  (Attempted x 2 trials, but pt is not able to motor plan to perform this transfer, therefore, buttocks did not raise up off the bed. )              Ambulation/Gait                 Stairs            Wheelchair Mobility    Modified Rankin (Stroke Patients Only)       Balance Overall balance assessment: History of Falls;Needs assistance Sitting-balance support: Single extremity supported;Feet supported Sitting balance-Leahy Scale: Poor Sitting balance - Comments: Pt demonstrates lean to the right and posterior with static sitting.  Able to assist pt with finding midline and center of gravity - at that point he was able to briefly hold static sitting  balance for a few seconds, but not able to maintain.  Worked on anterior/posterior, as well as medial/lateral weight shifting in sitting.  Also worked on trunk rotation both directions.  UE reaching tasks to engage trunk rotation, however pt is blind in R eye, therefore this is a difficult task.                              Cognition Arousal/Alertness: Awake/alert Behavior During Therapy: Agitated;Anxious;Impulsive (Crying out at times)     Orientation Level: Time;Situation;Disoriented to                  Exercises      General Comments        Pertinent Vitals/Pain Pain  Assessment:  (Pt denies pain, but cries out with movement of his extremities and states they are painful. )    Home Living                      Prior Function            PT Goals (current goals can now be found in the care plan section) Acute Rehab PT Goals Patient Stated Goal: to get stronger PT Goal Formulation: With patient/family Time For Goal Achievement: 06/02/16 Potential to Achieve Goals: Poor Progress towards PT goals: Not progressing toward goals - comment (pt has had multiple additional cardioembolic infarcts.  )    Frequency    Min 5X/week      PT Plan Current plan remains appropriate    Co-evaluation             End of Session Equipment Utilized During Treatment: Gait belt Activity Tolerance: Patient limited by fatigue;Patient tolerated treatment well Patient left: in bed;with call bell/phone within reach;with bed alarm set;with family/visitor present;Other (comment) (SLP present in the room. )     Time: 1610-9604 PT Time Calculation (min) (ACUTE ONLY): 38 min  Charges:  $Therapeutic Activity: 8-22 mins $Neuromuscular Re-education: 8-22 mins                    G Codes:  Functional Assessment Tool Used: [employer] "6-clicks"  Functional Limitation: Mobility: Walking and moving around Mobility: Walking and Moving Around Current Status [phone]): At least 80 percent but less than 100 percent impaired, limited or restricted Mobility: Walking and Moving Around Goal Status [phone]): At least 60 percent but less than 80 percent impaired, limited or restricted   Beth Alfard Cochrane, PT, DPT X: [phone]

## 2016-05-19 NOTE — Progress Notes (Signed)
Dr. Thedore MinsSingh called back and was notified, no orders received at this time. Stated pt will possibly be placed on hospice tomorrow.

## 2016-05-19 NOTE — Progress Notes (Signed)
Inpatient Diabetes Program Recommendations  AACE/ADA: New Consensus Statement on Inpatient Glycemic Control (2015)  Target Ranges:  Prepandial:   less than 140 mg/dL      Peak postprandial:   less than 180 mg/dL (1-2 hours)      Critically ill patients:  140 - 180 mg/dL   Results for [place] FolkBROWN, WAYNE (MRN [id number]) as of 05/19/2016 12:47  Ref. Range 05/11/2016 17:04 05/12/2016 05:44 05/13/2016 06:32 05/14/2016 07:00 05/16/2016 14:46 05/19/2016 06:50  Glucose Latest Ref Range: 65 - 99 mg/dL 784167 (H) 696161 (H) 295181 (H) 161 (H) 178 (H) 229 (H)   Review of Glycemic Control  Diabetes history: NO Outpatient Diabetes medications: NA Current orders for Inpatient glycemic control: None  Inpatient Diabetes Program Recommendations: Correction (SSI): Glucose values noted to be elevated (even prior to steroids). While inpatient, please consider ordering CBGs with Novolog correction scale ACHS. HgbA1C: Please consider ordering an A1C to evaluate glycemic control over the past 2-3 months.  Thanks, Orlando PennerMarie Alima Naser, RN, MSN, CDE Diabetes Coordinator Inpatient Diabetes Program [phone] (Team Pager from 8am to 5pm)

## 2016-05-19 NOTE — Progress Notes (Signed)
In to give PO meds. Pt is not swallowing. Just holding meds in mouth. Will notify hospitalist. Family states has been spitting meds out and will not swallow it, food either.

## 2016-05-19 NOTE — Progress Notes (Signed)
PROGRESS NOTE    Ronald Bruce  WUJ:[id number] DOB: 06/07/1935 DOA: 05/11/2016 PCP: Dwana MelenaZack Hall, MD   Brief Narrative: 81 year old male with history of prior stroke with residual left-sided hemiparesis, presented with generalized weakness and frequent falls not eating much. In the ER CT scan of head showed a small intracranial hemorrhage. MRI of the brain showed numerous small areas of acute cerebral infarction throughout both cerebral hemispheres and cerebellum bilateral. Also with positive troponin. Neurologist and cardiologist evaluated the patient.  Assessment & Plan:   # Acute cerebral infarction, multiple small areas of ischemia in bilateral cerebral hemispheres and cerebellum likely thrombo- embolic etiology. Also has acute hemorrhagic stroke at right parietal white matter: -MRI MRA : Multiple strokes and intracranial atherosclerotic disease. No large vessel occlusion noticed -Carotid ultrasound result reviewed. No significant stenosis. -PT, OT and social worker evaluation. Patient will be discharged to nursing facility for rehabilitation on discharge.  -Currently on Lipitor 40 mg. Serum LDL level is 73 -EEG unremarkable -Unable to give aspirin or anticoagulation because of intracranial hemorrhage. Discussed with the neurologist on 05/18/2016, plan is IV steroid trial to rule out vasculitis if no improvement by tomorrow  focus on comfort care/hospice. Also discussed with family bedside who agree. -Echocardiogram with normal systolic function, grade 1 diastolic dysfunction. - previous MD discussed with the cardiologist, Dr. Wyline MoodBranch on 05/13/2016, he will arrange for outpatient cardiac monitoring and possibly TEE. -Patient with worsening mental status MRI repeated on 05/17/2016 shows new multiple infarcts bilateral.  # Acute change in mental status, possibly due to acute stroke versus acute delirium: -Patient with gradual decline in cognition function. He was able to ambulate and talk on  admission although he had cognition impairment before admission. He was more alert but is still not following commands and not oriented today. Repeat CT scan of head showed no acute finding. Reevaluated by Dr. Gerilyn Pilgrimoonquah, I discussed with him today as well. He recommended EEG and MRI of the brain.  -Reportedly patient has foul-smelling urine. I'm concerned if infection might have worsened his mental status. Started IV ceftriaxone. Follow up culture results. -Cognition and swallowing evaluation by the swallow team. I discussed with them. Patient is currently nothing by mouth with only ice chips. We will continue to monitor. I discussed with the patient's sister at bedside at length regarding further care. If patient is not able to eat, probably comfort care and hospice care will be the next option. After I discussed with the patient's sister, not willing to do more invasive procedures including feeding tube placement. Emotional support provided to the patient's sister.  -I will continue IV fluid. Pepcid discontinued by neurologist. -Ordered MRI of the brain and low-dose Ativan before the procedure for agitation.  -Palliative care consult appreciated. Patient is now DNR/DNI.  #Elevated troponin level/NSTEMI: Peak troponin level of 1.4. Evaluated by cardiologist. Patient does not have any specific cardiopulmonary symptoms this time. He had generalized weakness and failure to thrive. EKG showed inferior and anterolateral Q waves. Unable to give aspirin or anticoagulation because of intracranial hemorrhage. Echo showed no regional wall motion abnormalities.  #Hypertension: Blood pressure elevated. Because of nothing by mouth he is not taking Norvasc, Avapro,  hydralazine and Bystolic today. I started hydralazine 10 mg every 6-8 hourly schedule. Monitor blood pressure. May need beta blocker depending on heart rate and blood pressure. -Avoid hypotensive episode.  #Hypokalemia: Monitor labs. Encourage oral  intake.  # Failure to thrive and possibly mild protein calorie malnutrition: speech and swallow evaluation ongoing, appreciated.  Patient will be NPO today. Continue to monitor mental status.  DVT prophylaxis: SCD . Code Status: Full code Family Communication: Patient's brother.sister at bedside.  Disposition Plan: Likely discharge to SNF in 1-2 days, may go with hospice care depending on further test results.  Consultants:   Neurologist and cardiologist  Procedures: MRI and MRA brain, EEG, carotid ultrasound. Echo  Antimicrobials: Ceftriaxone   started on January 8.  Subjective: Patient was seen and examined at bedside. He appears to be confused, still able to answer a few basic questions, denies any headache chest or abdominal pain.  Objective: Vitals:   05/17/16 2151 05/17/16 2329 05/18/16 1300 05/19/16 0200  BP: 127/62 (!) 152/82 (!) 145/70 134/78  Pulse: 67 80 84 76  Resp: 20 16 16 16   Temp: 98 F (36.7 C)  97.3 F (36.3 C)   TempSrc: Oral  Oral Oral  SpO2: 96%  93% 94%  Weight:      Height:        Intake/Output Summary (Last 24 hours) at 05/19/16 1128 Last data filed at 05/19/16 0300  Gross per 24 hour  Intake             3858 ml  Output              500 ml  Net             3358 ml   Filed Weights   05/11/16 1624 05/11/16 2332  Weight: 89.8 kg (198 lb) 70.2 kg (154 lb 11.2 oz)    Examination:  General exam: Alert, awake, not in distress.Marland Kitchen Respiratory system: There are bilateral, no wheezing or crackle. Cardiovascular system: Regular rate rhythm, S1-S2 normal. No pedal edema..  Gastrointestinal system: Bowel sound positive, soft, nontender. Nondistended. Central nervous system: Not following commands and not oriented.. Extremities: No cyanosis or edema.  Skin: No rashes, lesions or ulcers Psychiatry: Unable to assess.     Data Reviewed: I have personally reviewed following labs and imaging studies  CBC:  Recent Labs Lab 05/16/16 1446 05/17/16 2003   WBC 7.2 7.2  HGB 13.8 13.6  HCT 42.6 40.9  MCV 91.4 90.7  PLT 51* 57*   Basic Metabolic Panel:  Recent Labs Lab 05/13/16 0632 05/14/16 0700 05/16/16 1446 05/19/16 0650  NA 144 143 145 148*  K 3.3* 3.4* 3.4* 3.7  CL 112* 114* 115* 122*  CO2 25 25 24  21*  GLUCOSE 181* 161* 178* 229*  BUN 16 13 17  35*  CREATININE 0.87 0.82 1.00 1.05  CALCIUM 8.4* 8.3* 8.1* 7.8*  MG 1.7 2.0  --  1.7   GFR: Estimated Creatinine Clearance: 57.6 mL/min (by C-G formula based on SCr of 1.05 mg/dL). Liver Function Tests: No results for input(s): AST, ALT, ALKPHOS, BILITOT, PROT, ALBUMIN in the last 168 hours. No results for input(s): LIPASE, AMYLASE in the last 168 hours. No results for input(s): AMMONIA in the last 168 hours. Coagulation Profile: No results for input(s): INR, PROTIME in the last 168 hours. Cardiac Enzymes:  Recent Labs Lab 05/13/16 0632  TROPONINI 1.44*   BNP (last 3 results) No results for input(s): PROBNP in the last 8760 hours. HbA1C: No results for input(s): HGBA1C in the last 72 hours. CBG: No results for input(s): GLUCAP in the last 168 hours. Lipid Profile: No results for input(s): CHOL, HDL, LDLCALC, TRIG, CHOLHDL, LDLDIRECT in the last 72 hours. Thyroid Function Tests: No results for input(s): TSH, T4TOTAL, FREET4, T3FREE, THYROIDAB in the last 72 hours. Anemia Panel:  No results for input(s): VITAMINB12, FOLATE, FERRITIN, TIBC, IRON, RETICCTPCT in the last 72 hours. Sepsis Labs: No results for input(s): PROCALCITON, LATICACIDVEN in the last 168 hours.  Recent Results (from the past 240 hour(s))  Culture, Urine     Status: Abnormal (Preliminary result)   Collection Time: 05/16/16  4:44 PM  Result Value Ref Range Status   Specimen Description URINE, CLEAN CATCH  Final   Special Requests NONE  Final   Culture (A)  Final    >=100,000 COLONIES/mL KLEBSIELLA OXYTOCA >=100,000 COLONIES/mL MORGANELLA MORGANII CULTURE REINCUBATED FOR BETTER GROWTH Performed at  [facility]    Report Status PENDING  Incomplete   Organism ID, Bacteria KLEBSIELLA OXYTOCA (A)  Final   Organism ID, Bacteria MORGANELLA MORGANII (A)  Final      Susceptibility   Klebsiella oxytoca - MIC*    AMPICILLIN >=32 RESISTANT Resistant     CEFAZOLIN 8 SENSITIVE Sensitive     CEFTRIAXONE <=1 SENSITIVE Sensitive     CIPROFLOXACIN <=0.25 SENSITIVE Sensitive     GENTAMICIN <=1 SENSITIVE Sensitive     IMIPENEM <=0.25 SENSITIVE Sensitive     NITROFURANTOIN 32 SENSITIVE Sensitive     TRIMETH/SULFA <=20 SENSITIVE Sensitive     AMPICILLIN/SULBACTAM 16 INTERMEDIATE Intermediate     PIP/TAZO <=4 SENSITIVE Sensitive     Extended ESBL NEGATIVE Sensitive     * >=100,000 COLONIES/mL KLEBSIELLA OXYTOCA   Morganella morganii - MIC*    AMPICILLIN >=32 RESISTANT Resistant     CEFAZOLIN >=64 RESISTANT Resistant     CEFTRIAXONE <=1 SENSITIVE Sensitive     CIPROFLOXACIN <=0.25 SENSITIVE Sensitive     GENTAMICIN <=1 SENSITIVE Sensitive     IMIPENEM 1 SENSITIVE Sensitive     NITROFURANTOIN 64 RESISTANT Resistant     TRIMETH/SULFA <=20 SENSITIVE Sensitive     AMPICILLIN/SULBACTAM >=32 RESISTANT Resistant     PIP/TAZO <=4 SENSITIVE Sensitive     * >=100,000 COLONIES/mL [facility] MORGANII         Radiology Studies: Mr Brain 64 Contrast  Result Date: 05/17/2016 CLINICAL DATA:  81 y/o  M; stroke for follow-up. EXAM: MRI HEAD WITHOUT CONTRAST TECHNIQUE: Multiplanar, multiecho pulse sequences of the brain and surrounding structures were obtained without intravenous contrast. COMPARISON:  05/15/2016 CT head.  05/12/2016 MRI head. FINDINGS: Brain: In comparison with prior MRI of the brain there are multiple new foci of diffusion restriction most pronounced in the right superior parietal lobe and the bilateral occipital lobes, but new foci are present throughout the hemispheres bilaterally. Foci of diffusion restriction are associated with T2 FLAIR hyperintensity. Additionally there is a  background of advanced chronic microvascular ischemic change and parenchymal volume loss of the brain. There are several small chronic lacunar infarcts in the cerebellar hemispheres bilaterally and within the right basal ganglia extending into corona radiata. Stable subcentimeter subacute hematoma in the right posterior frontal region. Few additional punctate foci of susceptibility hypointensity are present in the brain for example in the left posterior temporal lobe compatible sequelae of old hemorrhage. No new susceptibility hypointensity is identified to suggest interval intracranial hemorrhage. No extra-axial collection or hydrocephalus. Vascular: Central flow voids are maintained. Skull and upper cervical spine: Normal marrow signal. Sinuses/Orbits: Negative. Other: None. IMPRESSION: 1. Interval development of multiple new foci of diffusion restriction throughout the brain compatible with new acute infarcts. New infarcts are in greatest concentration within the left high parietal lobe and bilateral occipital lobes. The pattern is most consistent with new cerebral emboli. 2. Stable subacute  hemorrhage in the right posterior frontal region. No new hemorrhage identified. These results will be called to the ordering clinician or representative by the Radiologist Assistant, and communication documented in the PACS or zVision Dashboard. Electronically Signed   By: Mitzi Hansen M.D.   On: 05/17/2016 18:02        Scheduled Meds: .  stroke: mapping our early stages of recovery book   Does not apply Once  . amLODipine  10 mg Oral Daily   And  . irbesartan  300 mg Oral Daily  . atorvastatin  40 mg Oral q1800  . cefTRIAXone (ROCEPHIN)  IV  1 g Intravenous Q24H  . docusate  200 mg Oral BID  . escitalopram  20 mg Oral Daily  . feeding supplement (ENSURE ENLIVE)  237 mL Oral BID BM  . hydrALAZINE  10 mg Intravenous Q8H  . methylPREDNISolone (SOLU-MEDROL) injection  1,000 mg Intravenous Daily  .  multivitamin with minerals  1 tablet Oral Daily  . nebivolol  10 mg Oral Daily  . potassium chloride  40 mEq Oral Daily  . QUEtiapine  50 mg Oral QHS  . sodium chloride flush  3 mL Intravenous Q12H  . tamsulosin  0.4 mg Oral Daily   Continuous Infusions: . dextrose 5 % and 0.9 % NaCl with KCl 20 mEq/L 100 mL/hr at 05/17/16 0736     LOS: 8 days    Leroy Sea, MD [facility] Pager [phone]  If 7PM-7AM, please contact night-coverage [email or web address] Password [facility] 05/19/2016, 11:28 AM

## 2016-05-19 NOTE — Progress Notes (Signed)
Pt condom cath came off. Pt and linens wet. Pt  Cleaned. All Linens changed. New condom cath placed.

## 2016-05-19 NOTE — Progress Notes (Signed)
Speech Language Pathology Treatment: Dysphagia;Cognitive-Linquistic  Patient Details Name: Ronald Bruce MRN: [id number] DOB: 02/03/1936 Today's Date: 05/19/2016 Time: 1145-1310  SLP Time Calculation (min) (ACUTE ONLY): 85 min  Assessment / Plan / Recommendation Clinical Impression  Pt seen while sitting up in bed for dysphagia and cognitive linguistic therapy. Pt's oral cavity (teeth, tongue, gums, soft palate, lips) were CAKED with dried secretions and food. SLP reoriented Pt to situation and explained reasons for oral care. Pt allowed SLP to complete oral hygiene (suction, toothbrush, toothettes, mouthwash, washcloth). Pt continues to present with cognitive deficits which inhibit safe and efficient po intake. Pt requires 1:1 assist in a quiet environment with verbal and gentle tactile cues to aid in feeding. Once oral care completed, Ronald Bruce was able to drink 240 mL NTL (cran/orange juice) out of a hard plastic cup with SLP facilitating self feeding via hand over hand and verbal cues. He responded well with gentle verbal cues to: "look at the cup in your hand, take a sip, use your lips, swallow". Pt's attention deficits, proprioception deficits, and distractibility will demand a patient and calm feeder. Over the course of my visit, he took: 240 mL NTL juice, 8 ounces thin water, and 4 oz sherbet.  Pt will do best with ice cream (COLD) and liquids at this time. Recommend offering Magic Cups, ice cream, cream based soups, and cold NTL presented in hard plastic cup (THIS WAS LEFT IN HIS ROOM). Strongly recommend follow up skilled SLP services at SNF to maximize potential. His dysphagia is primarily cognitively based and he will continue to do better with repetition and good ORAL CARE. He was otherwise extremely pleasant, cooperative, and with a good sense of humor. OT signed off last week when he was doing very well before his change in mental status. PLEASE order OT. He would likely benefit from  cotreatment sessions.   HPI HPI: Pt is a 81 year old male with history of prior stroke with residual left-sided hemiparesis, presented with generalized weakness and frequent falls not eating much. In the ER CT scan of head showed a small intracranial hemorrhage. MRI of the brain showed numerous small areas of acute cerebral infarction throughout both cerebral hemispheres and cerebellum bilateral. Also with positive troponin. Neurologist and cardiologist evaluated the patient.      SLP Plan  Continue with current plan of care     Recommendations  Diet recommendations: Dysphagia 1 (puree);Nectar-thick liquid Liquids provided via: No straw;Teaspoon Medication Administration: Other (Comment) Supervision: Staff to assist with self feeding;Full supervision/cueing for compensatory strategies Compensations: Minimize environmental distractions;Slow rate;Monitor for anterior loss Postural Changes and/or Swallow Maneuvers: Seated upright 90 degrees;Upright 30-60 min after meal                General recommendations: OT consult Oral Care Recommendations: Oral care QID;Oral care prior to ice chip/H20;Oral care before and after PO;Staff/trained caregiver to provide oral care Follow up Recommendations: Skilled Nursing facility Plan: Continue with current plan of care       Thank you,  Ronald Bruce, [name] [phone]                 Ronald Bruce 05/19/2016, 4:03 PM

## 2016-05-20 LAB — URINE CULTURE: Culture: >=100000 — AB

## 2016-05-20 LAB — BASIC METABOLIC PANEL
ANION GAP: 8 (ref 5–15)
BUN: 46 mg/dL — ABNORMAL HIGH (ref 6–20)
CHLORIDE: 115 mmol/L — AB (ref 101–111)
CO2: 19 mmol/L — ABNORMAL LOW (ref 22–32)
Calcium: 7.6 mg/dL — ABNORMAL LOW (ref 8.9–10.3)
Creatinine, Ser: 1.09 mg/dL (ref 0.61–1.24)
GFR calc Af Amer: >60 mL/min (ref >60)
GFR calc non Af Amer: >60 mL/min (ref >60)
GLUCOSE: 311 mg/dL — AB (ref 65–99)
POTASSIUM: 3.3 mmol/L — AB (ref 3.5–5.1)
Sodium: 142 mmol/L (ref 135–145)

## 2016-05-20 MED ORDER — POTASSIUM CHLORIDE 20 MEQ/15ML (10%) PO SOLN
40.0000 meq | Freq: Once | ORAL | Status: AC
  Administered 2016-05-20: 40 meq via ORAL
  Filled 2016-05-20: qty 30

## 2016-05-20 NOTE — Evaluation (Signed)
Occupational Therapy Evaluation Patient Details Name: Ronald Bruce MRN: [id number] DOB: 1936/03/09 Today's Date: 05/20/2016    History of Present Illness Ronald Bruce is an 81 y.o. 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  Work up in the ER included a head CT which showed a small intracebral hemorrhage, appeared not acute, along prior stroke.  He was found to have a troponin of 1.32.  EKG showed T wave inversion on precordial leads, but no acute changes.  He also has a K of 3.0, normal renal fx test, normal NH3, and a clear CXR, with fx rib.  Neurology was called, who felt that his intracranial blood is likely old.  Hospitalist was asked to admit him for further work up of intracranial bleed, elevated troponin, and failure to thrive.   Dx: NSTEMI, and CVA -possible embolic with evidence of hemorrhagic conversion.  MRI results show multiple new small infarcts bilaterally.     Clinical Impression   Pt awake, alert, sister present for OT evaluation. Pt has had significant change in function since initial evaluation on arrival. Pt is currently total care for ADL completion, requiring mechanical lift for bed<>chair transfer, and max A +2 for bed mobility. Recommend SNF on discharge, however sister/family are planning to take pt home. If pt returns home recommend [facility] services as well as hospital bed, wheelchair, and hoyer lift on discharge. Will continue to see pt while in acute care for improved strength and safety during ADL and transfer tasks.     Follow Up Recommendations  SNF;Home health OT    Equipment Recommendations  Hospital bed (hoyer lift)       Precautions / Restrictions Precautions Precautions: Fall Restrictions Weight Bearing Restrictions: No      Mobility Bed Mobility Overal bed mobility: Needs  Assistance Bed Mobility: Supine to Sit;Sit to Supine     Supine to sit: Max assist;+2 for physical assistance;+2 for safety/equipment        Transfers Overall transfer level: Needs assistance     Sit to Stand: Total assist;+2 physical assistance;+2 safety/equipment         General transfer comment: Attempted sit<>stand with SARA, however unable due to machine malfunction, therefore used Maxi move for transfer bed<>chair.          ADL Overall ADL's : Needs assistance/impaired                                       General ADL Comments: Pt is currently total care for ADL completion               Pertinent Vitals/Pain Pain Assessment:  (Pt crying out during tx, does not specify, but suspect the R LE. )     Hand Dominance Right   Extremity/Trunk Assessment Upper Extremity Assessment Upper Extremity Assessment: LUE deficits/detail LUE Deficits / Details: left hemiparesis from prior CVA: on prior evaluation: strength 3-/5 in shoulder, 4-/5 elbow, 3/5 wrist and grip; sensation deficits. Today: 1/12: Pt has 2-/5 strength in LUE, increased tone throughout LUE Sensation: decreased light touch LUE Coordination: decreased fine motor;decreased gross motor   Lower Extremity Assessment Lower Extremity Assessment: Defer to PT evaluation       Communication Communication Communication: No difficulties   Cognition  Arousal/Alertness: Awake/alert Behavior During Therapy: Agitated;Anxious;Impulsive (Still crying out at times. ) Overall Cognitive Status: History of cognitive impairments - at baseline Area of Impairment: Safety/judgement;Problem solving                              Home Living Family/patient expects to be discharged to:: Private residence Living Arrangements: Children Available Help at Discharge: Family Type of Home: House Home Access: Stairs to enter Secretary/administratorntrance Stairs-Number of Steps: 4   Home Layout: One level     Bathroom  Shower/Tub: Chief Strategy OfficerTub/shower unit   Bathroom Toilet: Standard     Home Equipment: Environmental consultantWalker - 2 wheels          Prior Functioning/Environment Level of Independence: Needs assistance  Gait / Transfers Assistance Needed: mod I with walker ADL's / Homemaking Assistance Needed: Pt has aide who comes for a few hours each day to assist with dressing/bathing/meal preparation and housework tasks            OT Problem List: Decreased strength;Decreased range of motion;Decreased activity tolerance;Impaired balance (sitting and/or standing);Impaired vision/perception;Decreased coordination;Decreased cognition;Decreased safety awareness;Decreased knowledge of use of DME or AE;Impaired UE functional use;Impaired tone   OT Treatment/Interventions: Self-care/ADL training;Therapeutic exercise;Neuromuscular education;DME and/or AE instruction;Therapeutic activities;Patient/family education    OT Goals(Current goals can be found in the care plan section) Acute Rehab OT Goals Patient Stated Goal: to get stronger OT Goal Formulation: Patient unable to participate in goal setting Time For Goal Achievement: 06/03/16 Potential to Achieve Goals: Good  OT Frequency: Min 2X/week           Co-evaluation PT/OT/SLP Co-Evaluation/Treatment: Yes Reason for Co-Treatment: Complexity of the patient's impairments (multi-system involvement);Necessary to address cognition/behavior during functional activity;For patient/therapist safety;To address functional/ADL transfers PT goals addressed during session: Mobility/safety with mobility;Balance;Strengthening/ROM OT goals addressed during session: ADL's and self-care;Proper use of Adaptive equipment and DME      End of Session    Activity Tolerance: Patient tolerated treatment well Patient left: in chair;with call bell/phone within reach;with chair alarm set;with family/visitor present   Time: 1005-1039 OT Time Calculation (min): 34 min Charges:  OT General  Charges $OT Visit: 1 Procedure OT Evaluation $OT Eval Moderate Complexity: 1 Procedure  Ezra SitesLeslie Marry Kusch, OTR/L  [phone] 05/20/2016, 11:36 AM

## 2016-05-20 NOTE — Clinical Social Work Note (Signed)
Patient's sister, Drenda FreezeFran, advised that she had spoken with the family and they had made the decision to take patient home with home health services. She stated that patient has a 24/7 caregiver and that the family will also be there daily to assist in patient's daily care.   CSW signing off.      Ashley Bultema, Juleen ChinaHeather D, LCSW

## 2016-05-20 NOTE — Progress Notes (Signed)
Physical Therapy Treatment Patient Details Name: Ronald Bruce MRN: [id number] DOB: 11/27/1935 Today's Date: 05/20/2016    History of Present Illness Ronald Bruce is an 81 y.o. 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  Work up in the ER included a head CT which showed a small intracebral hemorrhage, appeared not acute, along prior stroke.  He was found to have a troponin of 1.32.  EKG showed T wave inversion on precordial leads, but no acute changes.  He also has a K of 3.0, normal renal fx test, normal NH3, and a clear CXR, with fx rib.  Neurology was called, who felt that his intracranial blood is likely old.  Hospitalist was asked to admit him for further work up of intracranial bleed, elevated troponin, and failure to thrive.   Dx: NSTEMI, and CVA -possible embolic with evidence of hemorrhagic conversion.  MRI results show multiple new small infarcts bilaterally.      PT Comments    Pt received in bed, sister present, and pt is agreeable to PT tx.  Pt is very awake and talkative today.  He continues to require Max A +2 for supine<>sit, and Mod A for static sitting balance.  Attempted to have pt use SARA for sit<>stand trial, however SARA lift needs maintenance, and therefore, maxi move was utilized to transfer pt from the bed<>chair.  Continue to recommend SNF, however sister has expressed that they want to take him home with 24hr care, and HHPT.  He will need a hospital bed, wheelchair, as well as a hoyer lift to discharge home.     Follow Up Recommendations  SNF;Home health PT;Supervision/Assistance - 24 hour (Sister has expressed that they want to take the patient home.  She expressed that there will be someone with him all the time and she would like HHPT for hime.  )     Equipment Recommendations  Wheelchair  (measurements PT);Hospital bed;Wheelchair cushion (measurements PT);Other (comment) Michiel Sites(Hoyer lift)    Recommendations for Other Services       Precautions / Restrictions Precautions Precautions: Fall Restrictions Weight Bearing Restrictions: No    Mobility  Bed Mobility Overal bed mobility: Needs Assistance Bed Mobility: Supine to Sit;Sit to Supine     Supine to sit: Max assist;+2 for physical assistance;+2 for safety/equipment        Transfers Overall transfer level: Needs assistance     Sit to Stand: Total assist;+2 physical assistance;+2 safety/equipment         General transfer comment: Attempted sit<>stand with SARA, however unable due to machine malfunction, therefore used Maxi move for transfer bed<>chair.   Ambulation/Gait                 Stairs            Wheelchair Mobility    Modified Rankin (Stroke Patients Only)       Balance Overall balance assessment: History of Falls;Needs assistance Sitting-balance support: Single extremity supported;Feet supported   Sitting balance - Comments: Pt demonstrates posterior and right lean, and requires constant Mod A for sitting balance.                              Cognition Arousal/Alertness: Awake/alert Behavior During Therapy: Agitated;Anxious;Impulsive (Still crying out at times. )   Area  of Impairment: Safety/judgement;Problem solving                    Exercises      General Comments        Pertinent Vitals/Pain Pain Assessment:  (Pt crying out during tx, does not specify, but suspect the R LE. )    Home Living                      Prior Function            PT Goals (current goals can now be found in the care plan section) Acute Rehab PT Goals Patient Stated Goal: to get stronger PT Goal Formulation: With patient/family Time For Goal Achievement: 06/02/16 Potential to Achieve Goals: Poor Progress towards PT goals: Progressing toward goals     Frequency    Min 5X/week      PT Plan Current plan remains appropriate    Co-evaluation PT/OT/SLP Co-Evaluation/Treatment: Yes Reason for Co-Treatment: Complexity of the patient's impairments (multi-system involvement);Necessary to address cognition/behavior during functional activity;For patient/therapist safety;To address functional/ADL transfers PT goals addressed during session: Mobility/safety with mobility;Balance;Strengthening/ROM OT goals addressed during session: ADL's and self-care;Proper use of Adaptive equipment and DME     End of Session Equipment Utilized During Treatment: Other (comment) (Attempted SARA but machine malfunction, therefore used maxi move. ) Activity Tolerance: Patient limited by fatigue;Patient limited by pain Patient left: in chair;with call bell/phone within reach;with family/visitor present;with chair alarm set     Time: 1005-1039 PT Time Calculation (min) (ACUTE ONLY): 34 min  Charges:  $Therapeutic Activity: 8-22 mins                    G Codes:      Beth Aureliano Oshields, PT, DPT X: [phone]

## 2016-05-20 NOTE — Care Management (Signed)
List of Home Health agencies provided. CM did go over some options of home health, such as [facility] having home health and Hospice services as well as [facility]. Caregiver at bedside states she will share with POA. If patient discharges over the weekend, orders will need to be faxed.

## 2016-05-20 NOTE — Progress Notes (Signed)
Nance A. Merlene Laughter, MD     [email or web address]          Ronald Bruce is an 81 y.o. male.   ASSESSMENT/PLAN:     UPDATE 05-20-16 PRIMARY CNS VASCULITIS The patient continues to improve. This is evidenced by his family, staff and my evaluation. The patient should complete 5 days of IV high-dose steroids. Afterwards, he should be maintained on prednisone 60 mg and followed closely. The family reports that he has done well with by mouth intake today. He is also participated with physical therapy.       UPDATE: 05-19-16 The family and hospital staff reported that the patient has improved significantly today. He appears to be more lucid less agitated and the has been talking all day long. The family reports that he is being conversational and making sense throughout the day. He has improved his appetite and has eaten twice today. The speech therapist note is copied below. This improvement seems to be very suggestive of  Primary CNS vasculitis causing multiple progressive strokes. Given the above, I think we should push for the patient to complete 5 days of high-dose Solu-Medrol therapy.    [[[[[[[[[SPEECH THERAPY NOTE Pt will do best with ice cream (COLD) and liquids at this time. Recommend offering Magic Cups, ice cream, cream based soups, and cold NTL presented in hard plastic cup (THIS WAS LEFT IN HIS ROOM). Strongly recommend follow up skilled SLP services at SNF to maximize potential. His dysphagia is primarily cognitively based and he will continue to do better with repetition and good ORAL CARE. He was otherwise extremely pleasant, cooperative, and with a good sense of humor. OT signed off last week when he was doing very well before his change in mental status. PLEASE order OT. He would likely benefit from cotreatment sessions.]]]]]]]]]]]]]     UPDATE 05-18-16 Overall it appears that the patient is unchanged. He continues to have some confusion. He is  swallowing better but seem not to be eating enough to sustain adequate nutrition. He still is confused and agitated but this has improved. The patient has been started on high-dose Solu-Medrol as I'm concerned that he could have vasculitis as the main cause of his progressive multiple bilateral strokes. His C-reactive protein is also quite high. He will be given at least a 3 day and possibly a five-day course of high steroids and we'll see how he progresses.   UPDATE 05-17-16 The family is pleased with the progress overnight. He appears to be less confused and much less agitated today. He has been able to swallow and his diet has been upgraded from nothing by mouth  to soft diet. The report that he is much less combative. Unfortunately, the MRI results show multiple new small infarcts bilaterally. This is undoubtedly cardio embolic phenomena. However, given the patient's thrombocytopenia and small intracranial hemorrhage, I do not think that we can start the patient on anticoagulation. The case discussed at length with the family. We will repeat a CBC and may consider adding aspirin if his play constant have improved. I did discuss with the daughter to consider hospice versus long-term nursing home placement. Given the multiple infarcts, think his long-term prognosis for functional outcome is likely poor.    UPDATE 05-16-16 The patient has been quite confused and agitated over the weekend. He has been pulling at things and hitting after staff. The family has been concerned. Extensive discussions were done with the family. He has been started on Pepcid as he  only new medications. Repeat imaging shows essentially no significant changes. Repeat labs are also unrevealing. There reports that he has not had significant cognitive impairment dementia before that he did have some short-term memory impairment at baseline. I will discontinue the Pepcid. EEG will be obtained. Baseline oximetry on room air will also be done.  Because of the agitation seroquel will be added. If the above workup is unrevealing, repeat MRI will be done although he will most likely need to be sedated.      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 The etiology is most likely due to acute MI with embolic phenomena or atrial fibrillation. Valve disease is also an issue. The patient is to be kept off antiplatelet agents or anticoagulation for the next month.    Suspect the patient likely also has mild to moderate cognitive impairment at baseline. The patient most likely has vascular dementia.    Thrombocytopenia.  Vitamin B12 deficiency. Replacement will be initiated.   Hyperglycemia.         GENERAL: Pleasant average weight male in no acute distress. He is not agitated today and restless.  HEENT: Supple. Atraumatic normocephalic.   ABDOMEN: soft  EXTREMITIES: No edema   BACK: Normal.  SKIN: Normal by inspection.    MENTAL STATUS:  The patient is awake and alert in bed. He is talking with his brother. Patient is lucid but at times he has conversation that is nonsensical. He is definitely more engaging and appropriate. I appreciated no restlessness today.  CRANIAL NERVES: Pupils are - right pupil 5 mm and left 6 both are reactive; extra ocular movements are full, there is no significant nystagmus; visual fields are full; upper and lower facial muscles are normal in strength and symmetric, there is no flattening of the nasolabial folds; tongue is midline; uvula is midline; shoulder elevation is normal.  MOTOR: Patient has spasticity and contracture of the left upper extremity. The wife reports that he has had a stroke on the left side. He moves the right side decently and at least against gravity. He does follow commands by election of the right upper extremity.  COORDINATION: Left finger to nose is normal, right finger to nose is normal, No rest tremor; no intention tremor; no postural tremor; no  bradykinesia.  SENSATION: He responds to painful stem light bilaterally.    The repeat MRI is reviewed in person shows multiple new acute infarcts over the previous infarcts below their mostly seen in the cerebral hemispheres bilaterally. There is seen in the left posterior frontal, right frontal and right parietal areas.      Blood pressure (!) 114/57, pulse 62, temperature 97.5 F (36.4 C), temperature source Axillary, resp. rate 18, height 6' (1.829 m), weight 154 lb 11.2 oz (70.2 kg), SpO2 96 %.  Past Medical History:  Diagnosis Date  . Hypertension   . Stroke [facility])     History reviewed. No pertinent surgical history.  Family History  Problem Relation Age of Onset  . Hypertension Mother   . Pneumonia Mother   . Lung cancer Father     Social History:  reports that he has never smoked. He has never used smokeless tobacco. He reports that he drinks about 2.4 - 3.0 oz of alcohol per week . He reports that he does not use drugs.  Allergies: No Known Allergies  Medications: Prior to Admission medications   Medication Sig Start Date End Date Taking? Authorizing Provider  ALPRAZolam Duanne Moron) 1 MG tablet Take 1 mg  by mouth 3 (three) times daily as needed for anxiety.   Yes Historical Provider, MD  amLODipine-valsartan (EXFORGE) 10-320 MG tablet Take 1 tablet by mouth daily.   Yes Historical Provider, MD  cholecalciferol (VITAMIN D) 1000 units tablet Take 1,000 Units by mouth daily.   Yes Historical Provider, MD  clopidogrel (PLAVIX) 75 MG tablet Take 75 mg by mouth daily. 05/01/16  Yes Historical Provider, MD  escitalopram (LEXAPRO) 20 MG tablet Take 20 mg by mouth daily.   Yes Historical Provider, MD  nebivolol (BYSTOLIC) 10 MG tablet Take 10 mg by mouth daily.   Yes Historical Provider, MD  tamsulosin (FLOMAX) 0.4 MG CAPS capsule Take 0.4 mg by mouth daily.   Yes Historical Provider, MD    Scheduled Meds: . amLODipine  10 mg Oral Daily   And  . irbesartan  300 mg Oral  Daily  . atorvastatin  40 mg Oral q1800  . cefTRIAXone (ROCEPHIN)  IV  1 g Intravenous Q24H  . docusate  200 mg Oral BID  . escitalopram  20 mg Oral Daily  . feeding supplement (ENSURE ENLIVE)  237 mL Oral BID BM  . hydrALAZINE  10 mg Intravenous Q8H  . methylPREDNISolone (SOLU-MEDROL) injection  1,000 mg Intravenous Daily  . multivitamin with minerals  1 tablet Oral Daily  . nebivolol  10 mg Oral Daily  . potassium chloride  40 mEq Oral Daily  . QUEtiapine  50 mg Oral QHS  . sodium chloride flush  3 mL Intravenous Q12H  . tamsulosin  0.4 mg Oral Daily   Continuous Infusions:  PRN Meds:.haloperidol lactate, HYDROcodone-acetaminophen, morphine injection, RESOURCE THICKENUP CLEAR     Results for orders placed or performed during the hospital encounter of 05/11/16 (from the past 48 hour(s))  Basic metabolic panel     Status: Abnormal   Collection Time: 05/19/16  6:50 AM  Result Value Ref Range   Sodium 148 (H) 135 - 145 mmol/L   Potassium 3.7 3.5 - 5.1 mmol/L   Chloride 122 (H) 101 - 111 mmol/L   CO2 21 (L) 22 - 32 mmol/L   Glucose, Bld 229 (H) 65 - 99 mg/dL   BUN 35 (H) 6 - 20 mg/dL   Creatinine, Ser 1.05 0.61 - 1.24 mg/dL   Calcium 7.8 (L) 8.9 - 10.3 mg/dL   GFR calc non Af Amer >60 >60 mL/min   GFR calc Af Amer >60 >60 mL/min    Comment: (NOTE) The eGFR has been calculated using the CKD EPI equation. This calculation has not been validated in all clinical situations. eGFR's persistently <60 mL/min signify possible Chronic Kidney Disease.    Anion gap 5 5 - 15  Magnesium     Status: None   Collection Time: 05/19/16  6:50 AM  Result Value Ref Range   Magnesium 1.7 1.7 - 2.4 mg/dL  Basic metabolic panel     Status: Abnormal   Collection Time: 05/20/16  6:34 AM  Result Value Ref Range   Sodium 142 135 - 145 mmol/L   Potassium 3.3 (L) 3.5 - 5.1 mmol/L   Chloride 115 (H) 101 - 111 mmol/L   CO2 19 (L) 22 - 32 mmol/L   Glucose, Bld 311 (H) 65 - 99 mg/dL   BUN 46 (H) 6 -  20 mg/dL   Creatinine, Ser 1.09 0.61 - 1.24 mg/dL   Calcium 7.6 (L) 8.9 - 10.3 mg/dL   GFR calc non Af Amer >60 >60 mL/min   GFR calc Af  Amer >60 >60 mL/min    Comment: (NOTE) The eGFR has been calculated using the CKD EPI equation. This calculation has not been validated in all clinical situations. eGFR's persistently <60 mL/min signify possible Chronic Kidney Disease.    Anion gap 8 5 - 15    Studies/Results:     REPEAT MRI 05-17-16 FINDINGS: Brain: In comparison with prior MRI of the brain there are multiple new foci of diffusion restriction most pronounced in the right superior parietal lobe and the bilateral occipital lobes, but new foci are present throughout the hemispheres bilaterally. Foci of diffusion restriction are associated with T2 FLAIR hyperintensity. Additionally there is a background of advanced chronic microvascular ischemic change and parenchymal volume loss of the brain. There are several small chronic lacunar infarcts in the cerebellar hemispheres bilaterally and within the right basal ganglia extending into corona radiata.  Stable subcentimeter subacute hematoma in the right posterior frontal region. Few additional punctate foci of susceptibility hypointensity are present in the brain for example in the left posterior temporal lobe compatible sequelae of old hemorrhage. No new susceptibility hypointensity is identified to suggest interval intracranial hemorrhage.  No extra-axial collection or hydrocephalus.  Vascular: Central flow voids are maintained.  Skull and upper cervical spine: Normal marrow signal.  Sinuses/Orbits: Negative.  Other: None.  IMPRESSION: 1. Interval development of multiple new foci of diffusion restriction throughout the brain compatible with new acute infarcts. New infarcts are in greatest concentration within the left high parietal lobe and bilateral occipital lobes. The pattern is most consistent with new  cerebral emboli. 2. Stable subacute hemorrhage in the right posterior frontal region. No new hemorrhage identified.       REPEAT HEAD CT 1- 7- 18 FINDINGS: Brain: Stable 9 mm hyperdense acute right intracranial hemorrhage in the posterior right frontal lobe at the gray-white matter junction as before. No significant interval change.  Stable background atrophy and chronic white matter microvascular ischemic changes. Remote right basal ganglia infarcts with mild ex vacuo dilatation of the right lateral ventricle. No significant mass effect, midline shift, herniation, hydrocephalus, or extra-axial fluid collection. Cisterns remain patent. Cerebellar atrophy as well.  Vascular: Intracranial atherosclerosis noted.  Skull: Normal. Negative for fracture or focal lesion.  Sinuses/Orbits: No acute finding.  Other: None.  IMPRESSION: Stable 9 mm acute subcortical intracranial hemorrhage in the posterior right frontal lobe.  Other chronic findings as above. No significant interval change compared 05/11/2016.       TTE - Left ventricle: The cavity size was normal. Wall thickness was   increased in a pattern of mild LVH. Systolic function was normal.   The estimated ejection fraction was in the range of 50% to 55%.   Wall motion was normal; there were no regional wall motion   abnormalities. Doppler parameters are consistent with abnormal   left ventricular relaxation (grade 1 diastolic dysfunction). - Aortic valve: There was moderate regurgitation. Valve area (VTI):   2.56 cm^2. Valve area (Vmax): 2.6 cm^2. Valve area (Vmean): 2.11   cm^2. - Left atrium: The atrium was mildly dilated. - Atrial septum: No defect or patent foramen ovale was identified. - Technically adequate study.   Cerenity Goshorn A. Merlene Laughter, M.D.  Diplomate, Tax adviser of Psychiatry and Neurology ( Neurology). 05/20/2016, 7:31 PM

## 2016-05-20 NOTE — Progress Notes (Signed)
Daily Progress Note   Patient Name: Ronald Bruce       Date: 05/20/2016 DOB: Jan 11, 1936  Age: 81 y.o. MRN#: [id number] Attending Physician: Leroy Sea, MD Primary Care Physician: [name], MD Admit Date: 05/11/2016  Reason for Consultation/Follow-up: Establishing goals of care and Psychosocial/spiritual support  Subjective: Ronald Bruce is sitting up in his Geri chair. He is able to greet me, telling me his name and where we are. Present today at bedside is his sister Scarlette Calico. We talk about the plan to continue high-dose IV steroids for 2 more days. 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  I encourage Scarlette Calico to be watchful for possible return of symptoms 3+ days after placement of high-dose steroid routine. If Ronald Bruce were to have recurrence of symptoms after d/c to home, sister Scarlette Calico states that she would consider bringing hospice in the home versus returning to the hospital. Ronald Bruce is able to participate in his care, on a somewhat limited basis, when he has periods of lucidity.  Length of Stay: 9  Current Medications: Scheduled Meds:  .  stroke: mapping our early stages of recovery book   Does not apply Once  . amLODipine  10 mg Oral Daily   And  . irbesartan  300 mg Oral Daily  . atorvastatin  40 mg Oral q1800  . cefTRIAXone (ROCEPHIN)  IV  1 g Intravenous Q24H  . docusate  200 mg Oral BID  . escitalopram  20 mg Oral Daily  . feeding supplement (ENSURE ENLIVE)  237 mL Oral BID BM  . hydrALAZINE  10 mg Intravenous Q8H  . methylPREDNISolone (SOLU-MEDROL) injection  1,000 mg Intravenous Daily  . multivitamin with minerals  1 tablet Oral Daily  . nebivolol  10 mg Oral Daily  . potassium chloride  40 mEq Oral Daily  . QUEtiapine  50 mg Oral QHS  . sodium chloride flush  3 mL Intravenous  Q12H  . tamsulosin  0.4 mg Oral Daily    Continuous Infusions:   PRN Meds: haloperidol lactate, HYDROcodone-acetaminophen, morphine injection, RESOURCE THICKENUP CLEAR  Physical Exam  Constitutional: No distress.  Oriented to person and place, calling out "hey" at times  HENT:  Head: Normocephalic and atraumatic.  Cardiovascular: Normal rate and regular rhythm.   Pulmonary/Chest: Effort normal. No respiratory distress.  Abdominal: Soft. He  exhibits no distension.  Musculoskeletal: He exhibits no edema.  Neurological: He is alert.  Skin: Skin is warm and dry.  Bruising on arms  Nursing note and vitals reviewed.           Vital Signs: BP 134/77 (BP Location: Right Arm)   Pulse 65   Temp 97.4 F (36.3 C) (Axillary)   Resp 19   Ht 6' (1.829 m)   Wt 70.2 kg (154 lb 11.2 oz)   SpO2 96%   BMI 20.98 kg/m  SpO2: SpO2: 96 % O2 Device: O2 Device: Not Delivered O2 Flow Rate:    Intake/output summary:  Intake/Output Summary (Last 24 hours) at 05/20/16 1349 Last data filed at 05/20/16 0500  Gross per 24 hour  Intake             2109 ml  Output              300 ml  Net             1809 ml   LBM: Last BM Date: 05/14/16 Baseline Weight: Weight: 89.8 kg (198 lb) Most recent weight: Weight: 70.2 kg (154 lb 11.2 oz)       Palliative Assessment/Data:    Flowsheet Rows   Flowsheet Row Most Recent Value  Intake Tab  Referral Department  Hospitalist  Unit at Time of Referral  Med/Surg Unit  Palliative Care Primary Diagnosis  Neurology  Date Notified  05/15/16  Palliative Care Type  New Palliative care  Reason for referral  Clarify Goals of Care  Date of Admission  05/11/16  Date first seen by Palliative Care  05/16/16  # of days Palliative referral response time  1 Day(s)  # of days IP prior to Palliative referral  4  Clinical Assessment  Palliative Performance Scale Score  20%  Pain Max last 24 hours  Not able to report  Pain Min Last 24 hours  Not able to report    Dyspnea Max Last 24 Hours  Not able to report  Dyspnea Min Last 24 hours  Not able to report  Psychosocial & Spiritual Assessment  Palliative Care Outcomes  Patient/Family meeting held?  Yes  Who was at the meeting?  Sister Corinna CapraFrances Chatman, and her daughter Verlon AuLeslie  Palliative Care Outcomes  Provided advance care planning, Changed CPR status, Provided psychosocial or spiritual support, Clarified goals of care  Patient/Family wishes: Interventions discontinued/not started   Mechanical Ventilation  Palliative Care follow-up planned  -- [Follow-up while at [facility]]      Patient Active Problem List   Diagnosis Date Noted  . Encounter for hospice care discussion   . Protein-calorie malnutrition, severe 05/16/2016  . Palliative care encounter   . Goals of care, counseling/discussion   . DNR (do not resuscitate) discussion   . FTT (failure to thrive) in adult   . Stroke (cerebrum) (HCC)   . Hypokalemia   . Intracranial bleeding (HCC) 05/11/2016  . Dehydration 05/11/2016  . Elevated troponin 05/11/2016    Palliative Care Assessment & Plan   Patient Profile: 81 y.o.malewith past medical history of stroke with slight left hemiplegia BPH, likely hypertension based on medication listadmitted on 1/3/2018with intracranial bleeding. 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  Assessment: Intracranial hemorrhage;stable at this time,trial of high dose steroids. no interventions. 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 Increased troponins; unable to take  aspirin or anti-platelets at this time due to intracerebral hemorrhage. Stable, without complaint if chest pain.  Failure to thrive in adult;functional decline over the last several months. Continued declines in hospital, until improvement noted with high-dose IV steroids. I share my  concerns with sister Scarlette Calico, including the possibility for return of symptoms 3 to 5 days after high-dose IV steroids stopped.   Recommendations/Plan:  continue to treat the treatable but no extraordinary measures such as CPR or intubation. Home with the benefits of home health services including PT. If Ronald Bruce were to have recurrence of symptoms, sister Scarlette Calico states that she would consider bringing hospice in the home versus returning to the hospital.  Goals of Care and Additional Recommendations:  Limitations on Scope of Treatment: Continue to treat the treatable but no extraordinary measures such as CPR or intubation.  Code Status:    Code Status Orders        Start     Ordered   05/16/16 1601  Do not attempt resuscitation (DNR)  Continuous    Question Answer Comment  In the event of cardiac or respiratory ARREST Do not call a "code blue"   In the event of cardiac or respiratory ARREST Do not perform Intubation, CPR, defibrillation or ACLS   In the event of cardiac or respiratory ARREST Use medication by any route, position, wound care, and other measures to relive pain and suffering. May use oxygen, suction and manual treatment of airway obstruction as needed for comfort.      05/16/16 1601    Code Status History    Date Active Date Inactive Code Status Order ID Comments User Context   05/11/2016 11:37 PM 05/16/2016  4:01 PM Full Code [id number]  Houston Siren, MD Inpatient    Advance Directive Documentation   Flowsheet Row Most Recent Value  Type of Advance Directive  Healthcare Power of Attorney  Pre-existing out of facility DNR order (yellow form or pink MOST form)  No data  "MOST" Form in Place?  No data       Prognosis:   Unable to determine, based on outcomes.  3-6 months or less would not be surprising, based on families reported declines over the last few months prior to this hospitalization.  Discharge Planning:  Home with Home Health  Care plan was discussed  with nursing staff, case manager, social worker, and Dr. Thedore Mins on next rounds.  Thank you for allowing the Palliative Medicine Team to assist in the care of this patient.   Time In: 1045 Time Out: 1125 Total Time 40 minutes  Prolonged Time Billed  no       Greater than 50%  of this time was spent counseling and coordinating care related to the above assessment and plan.  Katheran Awe, NP  Please contact Palliative Medicine Team phone at [phone] for questions and concerns.

## 2016-05-20 NOTE — Progress Notes (Addendum)
PROGRESS NOTE    Ronald Bruce  GNF:[id number] DOB: 06/20/1935 DOA: 05/11/2016 PCP: [name], MD   Brief Narrative: 81 year old male with history of prior stroke with residual left-sided hemiparesis, presented with generalized weakness and frequent falls not eating much. In the ER CT scan of head showed a small intracranial hemorrhage. MRI of the brain showed numerous small areas of acute cerebral infarction throughout both cerebral hemispheres and cerebellum bilateral. Also with positive troponin. Neurologist and cardiologist evaluated the patient.  Assessment & Plan:   # Acute cerebral infarction, multiple small areas of ischemia in bilateral cerebral hemispheres and cerebellum likely thrombo- embolic etiology. Also has acute hemorrhagic stroke at right parietal white matter:  MRI MRA : Multiple strokes and intracranial atherosclerotic disease. No large vessel occlusion noticed, Carotid ultrasound result reviewed. No significant stenosis. TTE  unremarkable, full stroke protocol followed seen by cardiology and neurology.   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 He has been placed on 5 days of IV steroids high-dose by neurology. He has somewhat improved per neuro. However if patient declines any further family wants full comfort care hospice. No heroics but continue gentle medical treatment.   # Acute change in mental status, possibly due to acute stroke versus acute delirium: Treatment as for stroke plus supportive care for delirium  #Elevated troponin level/NSTEMI: Peak troponin level of 1.4. Evaluated by cardiologist. Her showed no wall motion abnormalities, patient had no chest pain, currently on statin and beta blocker for secondary prevention. No aspirin due to subacute hematoma in the brain.  #Hypertension: Entry on combination of beta blocker, Norvasc, ARB and as needed IV hydralazine.   # Failure to thrive and possibly mild protein calorie  malnutrition: speech and swallow evaluation ongoing, appreciated. Patient will be NPO today. Continue to monitor mental status.  # UTI - treated with rocephin.    DVT prophylaxis: SCD . Code Status: Full code Family Communication: Patient's brother.sister at bedside.  Disposition Plan: Likely discharge home Monday with PT-RN-S Work if declines the Hospice.  Consultants:   Neurologist and cardiologist  Procedures: MRI and MRA brain, EEG, carotid ultrasound. Echo  Antimicrobials: Ceftriaxone   started on January 8.  Subjective: Patient was seen and examined at bedside. He appears to be confused, still able to answer a few basic questions, denies any headache chest or abdominal pain.  Objective: Vitals:   05/19/16 2008 05/19/16 2030 05/20/16 0035 05/20/16 0432  BP:  134/64 (!) 131/59 134/77  Pulse:  71 66 65  Resp:  16 16 19   Temp:  98 F (36.7 C) 98 F (36.7 C) 97.4 F (36.3 C)  TempSrc:  Oral Axillary Axillary  SpO2: 95% 100% 97% 96%  Weight:      Height:        Intake/Output Summary (Last 24 hours) at 05/20/16 0848 Last data filed at 05/20/16 0500  Gross per 24 hour  Intake             2469 ml  Output              300 ml  Net             2169 ml   Filed Weights   05/11/16 1624 05/11/16 2332  Weight: 89.8 kg (198 lb) 70.2 kg (154 lb 11.2 oz)    Examination:  General exam: Alert, awake, not in distress.Marland Kitchen Respiratory system: There are bilateral, no wheezing or crackle. Cardiovascular system: Regular rate rhythm, S1-S2 normal. No pedal  edema..  Gastrointestinal system: Bowel sound positive, soft, nontender. Nondistended. Central nervous system: Not following commands and not oriented.. Extremities: No cyanosis or edema.  Skin: No rashes, lesions or ulcers Psychiatry: Unable to assess.     Data Reviewed: I have personally reviewed following labs and imaging studies  CBC:  Recent Labs Lab 05/16/16 1446 05/17/16 2003  WBC 7.2 7.2  HGB 13.8 13.6  HCT  42.6 40.9  MCV 91.4 90.7  PLT 51* 57*   Basic Metabolic Panel:  Recent Labs Lab 05/14/16 0700 05/16/16 1446 05/19/16 0650 05/20/16 0634  NA 143 145 148* 142  K 3.4* 3.4* 3.7 3.3*  CL 114* 115* 122* 115*  CO2 25 24 21* 19*  GLUCOSE 161* 178* 229* 311*  BUN 13 17 35* 46*  CREATININE 0.82 1.00 1.05 1.09  CALCIUM 8.3* 8.1* 7.8* 7.6*  MG 2.0  --  1.7  --    GFR: Estimated Creatinine Clearance: 55.5 mL/min (by C-G formula based on SCr of 1.09 mg/dL). Liver Function Tests: No results for input(s): AST, ALT, ALKPHOS, BILITOT, PROT, ALBUMIN in the last 168 hours. No results for input(s): LIPASE, AMYLASE in the last 168 hours. No results for input(s): AMMONIA in the last 168 hours. Coagulation Profile: No results for input(s): INR, PROTIME in the last 168 hours. Cardiac Enzymes: No results for input(s): CKTOTAL, CKMB, CKMBINDEX, TROPONINI in the last 168 hours. BNP (last 3 results) No results for input(s): PROBNP in the last 8760 hours. HbA1C: No results for input(s): HGBA1C in the last 72 hours. CBG: No results for input(s): GLUCAP in the last 168 hours. Lipid Profile: No results for input(s): CHOL, HDL, LDLCALC, TRIG, CHOLHDL, LDLDIRECT in the last 72 hours. Thyroid Function Tests: No results for input(s): TSH, T4TOTAL, FREET4, T3FREE, THYROIDAB in the last 72 hours. Anemia Panel: No results for input(s): VITAMINB12, FOLATE, FERRITIN, TIBC, IRON, RETICCTPCT in the last 72 hours. Sepsis Labs: No results for input(s): PROCALCITON, LATICACIDVEN in the last 168 hours.  Recent Results (from the past 240 hour(s))  Culture, Urine     Status: Abnormal   Collection Time: 05/16/16  4:44 PM  Result Value Ref Range Status   Specimen Description URINE, CLEAN CATCH  Final   Special Requests NONE  Final   Culture (A)  Final    >=100,000 COLONIES/mL KLEBSIELLA OXYTOCA >=100,000 COLONIES/mL MORGANELLA MORGANII 60,000 COLONIES/mL ESCHERICHIA COLI    Report Status 05/20/2016 FINAL   Final   Organism ID, Bacteria KLEBSIELLA OXYTOCA (A)  Final   Organism ID, Bacteria MORGANELLA MORGANII (A)  Final   Organism ID, Bacteria ESCHERICHIA COLI (A)  Final      Susceptibility   Escherichia coli - MIC*    AMPICILLIN 4 SENSITIVE Sensitive     CEFAZOLIN <=4 SENSITIVE Sensitive     CEFTRIAXONE <=1 SENSITIVE Sensitive     CIPROFLOXACIN 1 SENSITIVE Sensitive     GENTAMICIN <=1 SENSITIVE Sensitive     IMIPENEM <=0.25 SENSITIVE Sensitive     NITROFURANTOIN <=16 SENSITIVE Sensitive     TRIMETH/SULFA 40 SENSITIVE Sensitive     AMPICILLIN/SULBACTAM <=2 SENSITIVE Sensitive     PIP/TAZO <=4 SENSITIVE Sensitive     Extended ESBL NEGATIVE Sensitive     * 60,000 COLONIES/mL ESCHERICHIA COLI   Klebsiella oxytoca - MIC*    AMPICILLIN >=32 RESISTANT Resistant     CEFAZOLIN 8 SENSITIVE Sensitive     CEFTRIAXONE <=1 SENSITIVE Sensitive     CIPROFLOXACIN <=0.25 SENSITIVE Sensitive     GENTAMICIN <=1 SENSITIVE  Sensitive     IMIPENEM <=0.25 SENSITIVE Sensitive     NITROFURANTOIN 32 SENSITIVE Sensitive     TRIMETH/SULFA <=20 SENSITIVE Sensitive     AMPICILLIN/SULBACTAM 16 INTERMEDIATE Intermediate     PIP/TAZO <=4 SENSITIVE Sensitive     Extended ESBL NEGATIVE Sensitive     * >=100,000 COLONIES/mL KLEBSIELLA OXYTOCA   Morganella morganii - MIC*    AMPICILLIN >=32 RESISTANT Resistant     CEFAZOLIN >=64 RESISTANT Resistant     CEFTRIAXONE <=1 SENSITIVE Sensitive     CIPROFLOXACIN <=0.25 SENSITIVE Sensitive     GENTAMICIN <=1 SENSITIVE Sensitive     IMIPENEM 1 SENSITIVE Sensitive     NITROFURANTOIN 64 RESISTANT Resistant     TRIMETH/SULFA <=20 SENSITIVE Sensitive     AMPICILLIN/SULBACTAM >=32 RESISTANT Resistant     PIP/TAZO <=4 SENSITIVE Sensitive     * >=100,000 COLONIES/mL MORGANELLA MORGANII         Radiology Studies: No results found.   Anti-infectives    Start     Dose/Rate Route Frequency Ordered Stop   05/16/16 1645  cefTRIAXone (ROCEPHIN) 1 g in dextrose 5 % 50 mL IVPB      1 g 100 mL/hr over 30 Minutes Intravenous Every 24 hours 05/16/16 1644         Scheduled Meds: .  stroke: mapping our early stages of recovery book   Does not apply Once  . amLODipine  10 mg Oral Daily   And  . irbesartan  300 mg Oral Daily  . atorvastatin  40 mg Oral q1800  . cefTRIAXone (ROCEPHIN)  IV  1 g Intravenous Q24H  . docusate  200 mg Oral BID  . escitalopram  20 mg Oral Daily  . feeding supplement (ENSURE ENLIVE)  237 mL Oral BID BM  . hydrALAZINE  10 mg Intravenous Q8H  . methylPREDNISolone (SOLU-MEDROL) injection  1,000 mg Intravenous Daily  . multivitamin with minerals  1 tablet Oral Daily  . nebivolol  10 mg Oral Daily  . potassium chloride  40 mEq Oral Daily  . QUEtiapine  50 mg Oral QHS  . sodium chloride flush  3 mL Intravenous Q12H  . tamsulosin  0.4 mg Oral Daily   Continuous Infusions: . dextrose 110 mL/hr at 05/20/16 0823     LOS: 9 days    Leroy SeaSINGH,PRASHANT K, MD [facility] Pager [phone]  If 7PM-7AM, please contact night-coverage [email or web address] Password [email or web address] 05/20/2016, 8:48 AM

## 2016-05-20 NOTE — Progress Notes (Signed)
Inpatient Diabetes Program Recommendations  AACE/ADA: New Consensus Statement on Inpatient Glycemic Control (2015)  Target Ranges:  Prepandial:   less than 140 mg/dL      Peak postprandial:   less than 180 mg/dL (1-2 hours)      Critically ill patients:  140 - 180 mg/dL  Results for Ronald Bruce, Ronald Bruce (MRN [id number]) as of 05/20/2016 09:03  Ref. Range 05/11/2016 17:04 05/12/2016 05:44 05/13/2016 06:32 05/14/2016 07:00 05/16/2016 14:46 05/19/2016 06:50 05/20/2016 06:34  Glucose Latest Ref Range: 65 - 99 mg/dL 409167 (H) 811161 (H) 914181 (H) 161 (H) 178 (H) 229 (H) 311 (H)   Review of Glycemic Control  Diabetes history: NO Outpatient Diabetes medications: NA Current orders for Inpatient glycemic control: None  Inpatient Diabetes Program Recommendations: Correction (SSI): Glucose values noted to be elevated (even prior to steroids). While inpatient, please consider ordering CBGs with Novolog correction scale ACHS. HgbA1C: Please consider ordering an A1C to evaluate glycemic control over the past 2-3 months.  Thanks, Orlando PennerMarie Twila Rappa, RN, MSN, CDE Diabetes Coordinator Inpatient Diabetes Program [phone] (Team Pager from 8am to 5pm)

## 2016-05-21 LAB — GLUCOSE, CAPILLARY
Glucose-Capillary: 189 mg/dL — ABNORMAL HIGH (ref 65–99)
Glucose-Capillary: 225 mg/dL — ABNORMAL HIGH (ref 65–99)
Glucose-Capillary: 177 mg/dL — ABNORMAL HIGH (ref 65–99)

## 2016-05-21 LAB — POTASSIUM: Potassium: 3.9 mmol/L (ref 3.5–5.1)

## 2016-05-21 MED ORDER — INSULIN ASPART 100 UNIT/ML ~~LOC~~ SOLN
0.0000 [IU] | Freq: Three times a day (TID) | SUBCUTANEOUS | Status: DC
  Administered 2016-05-21: 3 [IU] via SUBCUTANEOUS
  Administered 2016-05-21: 5 [IU] via SUBCUTANEOUS
  Administered 2016-05-22 (×2): 3 [IU] via SUBCUTANEOUS

## 2016-05-21 MED ORDER — INSULIN ASPART 100 UNIT/ML ~~LOC~~ SOLN: 0.0000 [IU] | Freq: Every day | SUBCUTANEOUS | Status: DC

## 2016-05-21 MED ORDER — QUETIAPINE FUMARATE 25 MG PO TABS
50.0000 mg | ORAL_TABLET | Freq: Every day | ORAL | Status: DC
  Administered 2016-05-21: 50 mg via ORAL
  Filled 2016-05-21: qty 2

## 2016-05-21 MED ORDER — HALOPERIDOL LACTATE 5 MG/ML IJ SOLN
2.0000 mg | Freq: Once | INTRAMUSCULAR | Status: AC
  Administered 2016-05-21: 2 mg via INTRAVENOUS
  Filled 2016-05-21: qty 1

## 2016-05-21 MED ORDER — HALOPERIDOL LACTATE 5 MG/ML IJ SOLN: 2.0000 mg | Freq: Four times a day (QID) | INTRAMUSCULAR | Status: DC | PRN

## 2016-05-21 NOTE — Progress Notes (Signed)
PROGRESS NOTE    Ronald Bruce  ZOX:[id number] DOB: 05-20-1935 DOA: 05/11/2016 PCP: [name], MD   Brief Narrative:   81 year old male with history of prior stroke with residual left-sided hemiparesis, presented with generalized weakness and frequent falls not eating much. In the ER CT scan of head showed a small intracranial hemorrhage. MRI of the brain showed numerous small areas of acute cerebral infarction throughout both cerebral hemispheres and cerebellum bilateral. Also with positive troponin. Neurologist and cardiologist evaluated the patient.   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 Also has acute hemorrhagic stroke at right parietal white matter: MRI MRA : Multiple strokes and intracranial atherosclerotic disease. No large vessel occlusion noticed, Carotid ultrasound result reviewed. No significant stenosis. TTE  unremarkable, full stroke protocol followed seen by cardiology and neurology.   He is clinically showing improvement under the care of neurology, he has been placed on 5 days of IV steroids high-dose by neurology. Plan is to continue steroids for a total of 5 days thereafter discharge him home with PT and RN, if he continues to improve and medical treatment will be pursued. If he happens to decline in the future family is open for hospice. Of note his other workup for embolic stroke has been unremarkable.  Since he is showing improvement will check A1c to complete stroke workup. Currently sugars are high due to high-dose steroids. Cover with sliding scale while he is on steroids.  No results found for: HGBA1C  Lipid Panel     Component Value Date/Time   CHOL 120 05/12/2016 0541   TRIG 145 05/12/2016 0541   HDL 18 (L) 05/12/2016 0541   CHOLHDL 6.7 05/12/2016 0541   VLDL 29 05/12/2016 0541   LDLCALC 73  05/12/2016 0541      # Acute change in mental status, possibly due to acute stroke versus acute delirium: Treatment as for stroke plus supportive care for delirium   #Elevated troponin level/NSTEMI: Peak troponin level of 1.4. Evaluated by cardiologist. Her showed no wall motion abnormalities, patient had no chest pain, currently on statin and beta blocker for secondary prevention. No aspirin due to subacute hematoma in the brain.  #Hypertension: Entry on combination of beta blocker, Norvasc, ARB and as needed IV hydralazine.   # Failure to thrive and possibly mild protein calorie malnutrition: speech and swallow evaluation ongoing, appreciated. Patient will be NPO today. Continue to monitor mental status.    DVT prophylaxis: SCD Code Status: Full code Family Communication: Patient's brother.sister at bedside.  Disposition Plan: Likely discharge home Monday with PT-RN-S Work if declines the Hospice.  Consultants:   Neurologist and cardiologist  Procedures: MRI and MRA brain, EEG, carotid ultrasound. Echo  Antimicrobials: Ceftriaxone   started on January 8.  Subjective: Patient was seen and examined at bedside. He appears to be confused, still able to answer a few basic questions, denies any headache chest or abdominal pain.  Objective: Vitals:   05/20/16 0432 05/20/16 1408 05/20/16 2142 05/21/16 0457  BP: 134/77 (!) 114/57 (!) 144/62 (!) 138/57  Pulse: 65 62 72 62  Resp: 19 18 20 18   Temp: 97.4 F (36.3 C) 97.5 F (36.4 C) 98.4 F (36.9 C) 98.2 F (36.8 C)  TempSrc: Axillary Axillary Oral Axillary  SpO2: 96% 96% 95% 94%  Weight:      Height:        Intake/Output  Summary (Last 24 hours) at 05/21/16 0952 Last data filed at 05/21/16 0900  Gross per 24 hour  Intake              216 ml  Output              350 ml  Net             -134 ml   Filed Weights   05/11/16 1624 05/11/16 2332  Weight: 89.8 kg (198 lb) 70.2 kg (154 lb 11.2 oz)    Examination:  General  exam: Alert, awake, not in distress.Marland Kitchen. Respiratory system: There are bilateral, no wheezing or crackle. Cardiovascular system: Regular rate rhythm, S1-S2 normal. No pedal edema..  Gastrointestinal system: Bowel sound positive, soft, nontender. Nondistended. Central nervous system: Not following commands and not oriented.. Extremities: No cyanosis or edema.  Skin: No rashes, lesions or ulcers Psychiatry: Unable to assess.     Data Reviewed: I have personally reviewed following labs and imaging studies  CBC:  Recent Labs Lab 05/16/16 1446 05/17/16 2003  WBC 7.2 7.2  HGB 13.8 13.6  HCT 42.6 40.9  MCV 91.4 90.7  PLT 51* 57*   Basic Metabolic Panel:  Recent Labs Lab 05/16/16 1446 05/19/16 0650 05/20/16 0634 05/21/16 0627  NA 145 148* 142  --   K 3.4* 3.7 3.3* 3.9  CL 115* 122* 115*  --   CO2 24 21* 19*  --   GLUCOSE 178* 229* 311*  --   BUN 17 35* 46*  --   CREATININE 1.00 1.05 1.09  --   CALCIUM 8.1* 7.8* 7.6*  --   MG  --  1.7  --   --    GFR: Estimated Creatinine Clearance: 55.5 mL/min (by C-G formula based on SCr of 1.09 mg/dL). Liver Function Tests: No results for input(s): AST, ALT, ALKPHOS, BILITOT, PROT, ALBUMIN in the last 168 hours. No results for input(s): LIPASE, AMYLASE in the last 168 hours. No results for input(s): AMMONIA in the last 168 hours. Coagulation Profile: No results for input(s): INR, PROTIME in the last 168 hours. Cardiac Enzymes: No results for input(s): CKTOTAL, CKMB, CKMBINDEX, TROPONINI in the last 168 hours. BNP (last 3 results) No results for input(s): PROBNP in the last 8760 hours. HbA1C: No results for input(s): HGBA1C in the last 72 hours. CBG: No results for input(s): GLUCAP in the last 168 hours. Lipid Profile: No results for input(s): CHOL, HDL, LDLCALC, TRIG, CHOLHDL, LDLDIRECT in the last 72 hours. Thyroid Function Tests: No results for input(s): TSH, T4TOTAL, FREET4, T3FREE, THYROIDAB in the last 72 hours. Anemia  Panel: No results for input(s): VITAMINB12, FOLATE, FERRITIN, TIBC, IRON, RETICCTPCT in the last 72 hours. Sepsis Labs: No results for input(s): PROCALCITON, LATICACIDVEN in the last 168 hours.  Recent Results (from the past 240 hour(s))  Culture, Urine     Status: Abnormal   Collection Time: 05/16/16  4:44 PM  Result Value Ref Range Status   Specimen Description URINE, CLEAN CATCH  Final   Special Requests NONE  Final   Culture (A)  Final    >=100,000 COLONIES/mL KLEBSIELLA OXYTOCA >=100,000 COLONIES/mL MORGANELLA MORGANII 60,000 COLONIES/mL ESCHERICHIA COLI    Report Status 05/20/2016 FINAL  Final   Organism ID, Bacteria KLEBSIELLA OXYTOCA (A)  Final   Organism ID, Bacteria MORGANELLA MORGANII (A)  Final   Organism ID, Bacteria ESCHERICHIA COLI (A)  Final      Susceptibility   Escherichia coli - MIC*    AMPICILLIN  4 SENSITIVE Sensitive     CEFAZOLIN <=4 SENSITIVE Sensitive     CEFTRIAXONE <=1 SENSITIVE Sensitive     CIPROFLOXACIN 1 SENSITIVE Sensitive     GENTAMICIN <=1 SENSITIVE Sensitive     IMIPENEM <=0.25 SENSITIVE Sensitive     NITROFURANTOIN <=16 SENSITIVE Sensitive     TRIMETH/SULFA 40 SENSITIVE Sensitive     AMPICILLIN/SULBACTAM <=2 SENSITIVE Sensitive     PIP/TAZO <=4 SENSITIVE Sensitive     Extended ESBL NEGATIVE Sensitive     * 60,000 COLONIES/mL ESCHERICHIA COLI   Klebsiella oxytoca - MIC*    AMPICILLIN >=32 RESISTANT Resistant     CEFAZOLIN 8 SENSITIVE Sensitive     CEFTRIAXONE <=1 SENSITIVE Sensitive     CIPROFLOXACIN <=0.25 SENSITIVE Sensitive     GENTAMICIN <=1 SENSITIVE Sensitive     IMIPENEM <=0.25 SENSITIVE Sensitive     NITROFURANTOIN 32 SENSITIVE Sensitive     TRIMETH/SULFA <=20 SENSITIVE Sensitive     AMPICILLIN/SULBACTAM 16 INTERMEDIATE Intermediate     PIP/TAZO <=4 SENSITIVE Sensitive     Extended ESBL NEGATIVE Sensitive     * >=100,000 COLONIES/mL KLEBSIELLA OXYTOCA   Morganella morganii - MIC*    AMPICILLIN >=32 RESISTANT Resistant      CEFAZOLIN >=64 RESISTANT Resistant     CEFTRIAXONE <=1 SENSITIVE Sensitive     CIPROFLOXACIN <=0.25 SENSITIVE Sensitive     GENTAMICIN <=1 SENSITIVE Sensitive     IMIPENEM 1 SENSITIVE Sensitive     NITROFURANTOIN 64 RESISTANT Resistant     TRIMETH/SULFA <=20 SENSITIVE Sensitive     AMPICILLIN/SULBACTAM >=32 RESISTANT Resistant     PIP/TAZO <=4 SENSITIVE Sensitive     * >=100,000 COLONIES/mL MORGANELLA MORGANII         Radiology Studies: No results found.      Scheduled Meds: . amLODipine  10 mg Oral Daily   And  . irbesartan  300 mg Oral Daily  . atorvastatin  40 mg Oral q1800  . cefTRIAXone (ROCEPHIN)  IV  1 g Intravenous Q24H  . docusate  200 mg Oral BID  . escitalopram  20 mg Oral Daily  . feeding supplement (ENSURE ENLIVE)  237 mL Oral BID BM  . hydrALAZINE  10 mg Intravenous Q8H  . methylPREDNISolone (SOLU-MEDROL) injection  1,000 mg Intravenous Daily  . multivitamin with minerals  1 tablet Oral Daily  . nebivolol  10 mg Oral Daily  . potassium chloride  40 mEq Oral Daily  . QUEtiapine  50 mg Oral QHS  . sodium chloride flush  3 mL Intravenous Q12H  . tamsulosin  0.4 mg Oral Daily   Continuous Infusions:    LOS: 10 days    Leroy Sea, MD [facility] Pager [phone]  If 7PM-7AM, please contact night-coverage [email or web address] Password [facility] 05/21/2016, 9:52 AM

## 2016-05-22 LAB — GLUCOSE, CAPILLARY
GLUCOSE-CAPILLARY: 168 mg/dL — AB (ref 65–99)
Glucose-Capillary: 191 mg/dL — ABNORMAL HIGH (ref 65–99)

## 2016-05-22 LAB — HEMOGLOBIN A1C
Hgb A1c MFr Bld: 6.3 % — ABNORMAL HIGH (ref 4.8–5.6)
MEAN PLASMA GLUCOSE: 134 mg/dL

## 2016-05-22 MED ORDER — PREDNISONE 20 MG PO TABS: 60.0000 mg | ORAL_TABLET | Freq: Every day | ORAL | 0 refills | Status: AC

## 2016-05-22 MED ORDER — ATORVASTATIN CALCIUM 40 MG PO TABS: 40.0000 mg | ORAL_TABLET | Freq: Every day | ORAL | 0 refills | Status: DC

## 2016-05-22 MED ORDER — DOCUSATE SODIUM 50 MG/5ML PO LIQD: 200.0000 mg | Freq: Two times a day (BID) | ORAL | 0 refills | Status: AC

## 2016-05-22 MED ORDER — MORPHINE SULFATE (CONCENTRATE) 10 MG/0.5ML PO SOLN: 10.0000 mg | Freq: Four times a day (QID) | ORAL | 0 refills | Status: DC | PRN

## 2016-05-22 MED ORDER — ALPRAZOLAM 0.5 MG PO TABS: 0.5000 mg | ORAL_TABLET | Freq: Two times a day (BID) | ORAL | 0 refills | Status: AC | PRN

## 2016-05-22 MED ORDER — ATORVASTATIN CALCIUM 40 MG PO TABS: 40.0000 mg | ORAL_TABLET | Freq: Every day | ORAL | 0 refills | Status: AC

## 2016-05-22 MED ORDER — QUETIAPINE FUMARATE 50 MG PO TABS: 25.0000 mg | ORAL_TABLET | Freq: Two times a day (BID) | ORAL | 0 refills | Status: AC

## 2016-05-22 MED ORDER — DOCUSATE SODIUM 50 MG/5ML PO LIQD: 200.0000 mg | Freq: Two times a day (BID) | ORAL | 0 refills | Status: DC

## 2016-05-22 MED ORDER — QUETIAPINE FUMARATE 50 MG PO TABS: 25.0000 mg | ORAL_TABLET | Freq: Two times a day (BID) | ORAL | 0 refills | Status: DC

## 2016-05-22 NOTE — Discharge Instructions (Signed)
Follow with Primary MD Dwana MelenaZack Hall, MD in 7 days   Get CBC, CMP, 2 view Chest X ray checked  by Primary MD or SNF MD in 5-7 days ( we routinely change or add medications that can affect your baseline labs and fluid status, therefore we recommend that you get the mentioned basic workup next visit with your PCP, your PCP may decide not to get them or add new tests based on their clinical decision)   Activity: As tolerated with Full fall precautions use walker/cane & assistance as needed   Disposition Home    Diet:   DIET - DYS 1 with feeding assistance and aspiration precautions.  For Heart failure patients - Check your Weight same time everyday, if you gain over 2 pounds, or you develop in leg swelling, experience more shortness of breath or chest pain, call your Primary MD immediately. Follow Cardiac Low Salt Diet and 1.5 lit/day fluid restriction.   On your next visit with your primary care physician please Get Medicines reviewed and adjusted.   Please request your Prim.MD to go over all Hospital Tests and Procedure/Radiological results at the follow up, please get all Hospital records sent to your Prim MD by signing hospital release before you go home.   If you experience worsening of your admission symptoms, develop shortness of breath, life threatening emergency, suicidal or homicidal thoughts you must seek medical attention immediately by calling 911 or calling your MD immediately  if symptoms less severe.  You Must read complete instructions/literature along with all the possible adverse reactions/side effects for all the Medicines you take and that have been prescribed to you. Take any new Medicines after you have completely understood and accpet all the possible adverse reactions/side effects.   Do not drive, operate heavy machinery, perform activities at heights, swimming or participation in water activities or provide baby sitting services if your were admitted for syncope or  siezures until you have seen by Primary MD or a Neurologist and advised to do so again.  Do not drive when taking Pain medications.    Do not take more than prescribed Pain, Sleep and Anxiety Medications  Special Instructions: If you have smoked or chewed Tobacco  in the last 2 yrs please stop smoking, stop any regular Alcohol  and or any Recreational drug use.  Wear Seat belts while driving.   Please note  You were cared for by a hospitalist during your hospital stay. If you have any questions about your discharge medications or the care you received while you were in the hospital after you are discharged, you can call the unit and asked to speak with the hospitalist on call if the hospitalist that took care of you is not available. Once you are discharged, your primary care physician will handle any further medical issues. Please note that NO REFILLS for any discharge medications will be authorized once you are discharged, as it is imperative that you return to your primary care physician (or establish a relationship with a primary care physician if you do not have one) for your aftercare needs so that they can reassess your need for medications and monitor your lab values.

## 2016-05-22 NOTE — Progress Notes (Signed)
IV removed,WNL. D/C given to patient's POA. Signed and verbalized understanding. Pt transported to home via EMS.

## 2016-05-22 NOTE — Discharge Summary (Signed)
Ronald Bruce ZOX:[id number] DOB: 17-Sep-1935 DOA: 05/11/2016  PCP: Ronald Melena, MD  Admit date: 05/11/2016  Discharge date: 05/22/2016  Admitted From: Home   Disposition:  Home   Recommendations for Outpatient Follow-up:   Follow up with PCP in 1-2 weeks  PCP Please obtain BMP/CBC, 2 view CXR in 1week,  (see Discharge instructions)   PCP Please follow up on the following pending results: None   Home Health: PT-RN,Speech, S work   Equipment/Devices: Bed  Consultations: [employer] [facility], Neuro Discharge Condition: Guarded   CODE STATUS: DNR   Diet Recommendation: DIET - DYS 1   Chief Complaint  Patient presents with  . Altered Mental Status     Brief history of present illness from the day of admission and additional interim summary    81 year old male with history of prior stroke with residual left-sided hemiparesis, presented with generalized weakness and frequent falls not eating much. In the ER CT scan of head showed a small intracranial hemorrhage. MRI of the brain showed numerous small areas of acute cerebral infarction throughout both cerebral hemispheres and cerebellum bilateral. Also with positive troponin. Neurologist and cardiologist evaluated the patient.  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 Also has acute hemorrhagic stroke at right parietal white matter: MRI MRA : Multiple strokes and intracranial atherosclerotic disease. No large vessel occlusion noticed, Carotid ultrasound result reviewed. No significant stenosis. TTE  unremarkable, full stroke protocol followed seen by cardiology and neurology.   He is clinically showing improvement  under the care of neurology, he has been placed on 5 days of IV steroids high-dose by neurology. He will finish 5 days of IV steroids thereafter will be placed on 60 mg of prednisone and will follow with neurology outpatient within a week post discharge, he has shown some clinical improvement however for meaningful recovery is yet to be obtained.   Plan is to discharge him on oral steroids and supportive care with home PT, RN and speech therapy, follow with PCP and neurology if he continues to show clinical improvement continue. If he happens to decline in the future family is open for hospice. Of note his other workup for embolic stroke has been unremarkable.   # Acute change in mental status, possibly due to CNS vasculitis as above along with acute delirium: Treatment as above plus supportive care for delirium  #Elevated troponin level/NSTEMI: Peak troponin level of 1.4. Evaluated by cardiologist. Her showed no wall motion abnormalities, patient had no chest pain, currently on statin and beta blocker for secondary prevention. No aspirin due to subacute hematoma in the brain.  #Hypertension: Stable on combination of beta blocker, Norvasc, ARB and as needed IV hydralazine.  # Failure to thrive and possibly mild protein calorie malnutrition, dysphagia: Seen by speech on dysphagia 1 diet will get speech therapy at home.  # UTI. Treated.   Discharge diagnosis  Principal Problem:   Intracranial bleeding (HCC) Active Problems:   Dehydration   Elevated troponin   Stroke (cerebrum) (HCC)   Hypokalemia   FTT (failure to thrive) in adult   Protein-calorie malnutrition, severe   Palliative care encounter   Goals of care, counseling/discussion   DNR (do not resuscitate) discussion   Encounter for hospice care discussion    Discharge instructions    Discharge Instructions    Discharge instructions    Complete by:  As directed    Follow with Primary MD Ronald MelenaZack Hall, MD in 7 days    Get CBC, CMP, 2 view Chest X ray checked  by Primary MD or SNF MD in 5-7 days ( we routinely change or add medications that can affect your baseline labs and fluid status, therefore we recommend that you get the mentioned basic workup next visit with your PCP, your PCP may decide not to get them or add new tests based on their clinical decision)   Activity: As tolerated with Full fall precautions use walker/cane & assistance as needed   Disposition Home    Diet:   DIET - DYS 1 with feeding assistance and aspiration precautions.  For Heart failure patients - Check your Weight same time everyday, if you gain over 2 pounds, or you develop in leg swelling, experience more shortness of breath or chest pain, call your Primary MD immediately. Follow Cardiac Low Salt Diet and 1.5 lit/day fluid restriction.   On your next visit with your primary care physician please Get Medicines reviewed and adjusted.   Please request your Prim.MD to go over all Hospital Tests and Procedure/Radiological results at the follow up, please get all Hospital records sent to your Prim MD by signing hospital release before you go home.   If you experience worsening of your admission symptoms, develop shortness of breath, life threatening emergency, suicidal or homicidal thoughts you must seek medical attention immediately by calling 911 or calling your MD immediately  if symptoms less severe.  You Must read complete instructions/literature along with all the possible adverse reactions/side effects for all the Medicines you take and that have been prescribed to you. Take any new Medicines after you have completely understood and accpet all the possible adverse reactions/side effects.   Do not drive, operate heavy machinery, perform activities at heights, swimming or participation in water activities or provide baby sitting services if your were admitted for syncope or siezures until you have seen by Primary MD or a  Neurologist and advised to do so again.  Do not drive when taking Pain medications.    Do not take more than prescribed Pain, Sleep and Anxiety Medications  Special Instructions: If you have smoked or chewed Tobacco  in the last 2 yrs please stop smoking, stop any regular Alcohol  and or any Recreational drug use.  Wear Seat belts while driving.   Please note  You were cared for by a hospitalist during your hospital stay. If you have any questions about your discharge medications or the care you received while you were in the hospital after you are discharged, you can call the unit and asked to speak with the hospitalist on call if the hospitalist that took care of you is not available. Once you are discharged, your primary care physician will handle any further medical issues. Please note that NO REFILLS for any discharge medications will be authorized once you are discharged, as it is imperative that you return to your primary care  physician (or establish a relationship with a primary care physician if you do not have one) for your aftercare needs so that they can reassess your need for medications and monitor your lab values.   Increase activity slowly    Complete by:  As directed       Discharge Medications   Allergies as of 05/22/2016   No Known Allergies     Medication List    STOP taking these medications   clopidogrel 75 MG tablet Commonly known as:  PLAVIX     TAKE these medications   ALPRAZolam 0.5 MG tablet Commonly known as:  XANAX Take 1 tablet (0.5 mg total) by mouth 2 (two) times daily as needed for anxiety. What changed:  medication strength  how much to take  when to take this   amLODipine-valsartan 10-320 MG tablet Commonly known as:  EXFORGE Take 1 tablet by mouth daily.   atorvastatin 40 MG tablet Commonly known as:  LIPITOR Take 1 tablet (40 mg total) by mouth daily at 6 PM. Can switch to any other approved statin   cholecalciferol 1000 units  tablet Commonly known as:  VITAMIN D Take 1,000 Units by mouth daily.   docusate 50 MG/5ML liquid Commonly known as:  COLACE Take 20 mLs (200 mg total) by mouth 2 (two) times daily.   escitalopram 20 MG tablet Commonly known as:  LEXAPRO Take 20 mg by mouth daily.   nebivolol 10 MG tablet Commonly known as:  BYSTOLIC Take 10 mg by mouth daily.   predniSONE 20 MG tablet Commonly known as:  DELTASONE Take 3 tablets (60 mg total) by mouth daily with breakfast.   QUEtiapine 50 MG tablet Commonly known as:  SEROQUEL Take 0.5 tablets (25 mg total) by mouth 2 (two) times daily.   tamsulosin 0.4 MG Caps capsule Commonly known as:  FLOMAX Take 0.4 mg by mouth daily.            Durable Medical Equipment        Start     Ordered   05/20/16 1120  For home use only DME Hospital bed  Once    Question Answer Comment  Patient has (list medical condition): Stroke, weakness   The above medical condition requires: Patient requires the ability to reposition frequently   Head must be elevated greater than: 45 degrees   Bed type Semi-electric   Hoyer Lift Yes   Trapeze Bar Yes      05/20/16 1120       Contact information for follow-up providers    Ronald Melena, MD. Schedule an appointment as soon as possible for a visit in 1 week(s).   Specialty:  Internal Medicine Contact information: [address] [place] [address] [phone]        [facility], Darleen Crocker, MD. Schedule an appointment as soon as possible for a visit in 1 week(s).   Specialty:  Neurology Contact information: [address] [place] [address] [phone]            Contact information for after-discharge care    Destination    [facility] .   Contact information: [address] [place] [address] [phone]                  Major procedures and Radiology Reports - PLEASE review detailed and final reports thoroughly  -        Dg Ribs  Unilateral W/chest Right  Result Date: 05/11/2016 CLINICAL  DATA:  Fall with right lateral rib pain. Initial encounter. EXAM: RIGHT RIBS AND CHEST - 3+ VIEW COMPARISON:  None. FINDINGS: The heart size and mediastinal contours are normal. Lung volumes are low bilaterally. There is no evidence of pulmonary edema, consolidation, pneumothorax, nodule or pleural fluid. There is evidence of a relatively nondisplaced fracture involving the distal aspect of the right eleventh rib. No other injuries identified. No evidence of bony lesions. IMPRESSION: Nondisplaced fracture involving the distal right eleventh rib. Electronically Signed   By: Irish Lack M.D.   On: 05/11/2016 19:15   Ct Head Wo Contrast  Result Date: 05/15/2016 CLINICAL DATA:  Aphasic, intracranial hemorrhage EXAM: CT HEAD WITHOUT CONTRAST TECHNIQUE: Contiguous axial images were obtained from the base of the skull through the vertex without intravenous contrast. COMPARISON:  05/11/2016 FINDINGS: Brain: Stable 9 mm hyperdense acute right intracranial hemorrhage in the posterior right frontal lobe at the gray-white matter junction as before. No significant interval change. Stable background atrophy and chronic white matter microvascular ischemic changes. Remote right basal ganglia infarcts with mild ex vacuo dilatation of the right lateral ventricle. No significant mass effect, midline shift, herniation, hydrocephalus, or extra-axial fluid collection. Cisterns remain patent. Cerebellar atrophy as well. Vascular: Intracranial atherosclerosis noted. Skull: Normal. Negative for fracture or focal lesion. Sinuses/Orbits: No acute finding. Other: None. IMPRESSION: Stable 9 mm acute subcortical intracranial hemorrhage in the posterior right frontal lobe. Other chronic findings as above. No significant interval change compared 05/11/2016. Electronically Signed   By: Judie Petit.  Shick M.D.   On: 05/15/2016 14:36   Ct Head Wo Contrast  Result Date: 05/11/2016 CLINICAL  DATA:  Confusion and falls.  Previous stroke. EXAM: CT HEAD WITHOUT CONTRAST TECHNIQUE: Contiguous axial images were obtained from the base of the skull through the vertex without intravenous contrast. COMPARISON:  None available. FINDINGS: Brain: A 9 mm hyperdense focus is seen in the posterior right frontal lobe near the precentral gyrus, just above the sylvian fissure. No other acute hemorrhage is present. A remote right lentiform nucleus and white matter infarct is present. Asymmetric white matter hypoattenuation is present on the right. There is moderate diffuse atrophy. The remote infarct involves the anterior right insular cortex. Remote lacunar infarcts are present in the cerebellum bilaterally. Calcifications are present along the tentorium. Vascular: Dense calcifications are present in the cavernous internal carotid arteries and at the dural margin of the vertebral arteries bilaterally. There is no hyperdense vessel. Skull: The calvarium is intact. An unerupted right maxillary molar is present. No focal lytic or blastic lesions are present. No significant extracranial soft tissue injury is present. Sinuses/Orbits: Mild mucosal thickening is present along the floor of the maxillary sinuses bilaterally. The remaining paranasal sinuses and the mastoid air cells are clear. There are no fluid levels. A left lens extraction is noted. The globes and orbits are otherwise within normal limits. IMPRESSION: 1. 9 mm focal cortical/subcortical hemorrhage in the lateral aspect of the posterior right frontal lobe subjacent to the precentral gyrus. This likely reflects a small parenchymal hemorrhage. Underlying lesion is not excluded. 2. Remote infarct involving the right lentiform nucleus, anterior insular ribbon, and and superior white matter. 3. Moderate diffuse atrophy and white matter disease likely reflects the sequela of chronic microvascular ischemia. These results were called by telephone at the time of  interpretation on 05/11/2016 at 7:22 pm to Dr. Loren Racer , who verbally acknowledged these results. Electronically Signed   By: Marin Roberts M.D.   On: 05/11/2016 19:22  Mr Maxine Glenn Head Wo Contrast  Result Date: 05/12/2016 CLINICAL DATA:  Stroke.  Hypertension EXAM: MRA HEAD WITHOUT CONTRAST TECHNIQUE: Angiographic images of the Circle of Willis were obtained using MRA technique without intravenous contrast. COMPARISON:  MRI head 05/12/2016 FINDINGS: Both vertebral arteries patent to the basilar without significant stenosis. PICA patent. Mild irregularity of the basilar without significant stenosis. Superior cerebellar arteries patent bilaterally. Irregularity and moderate stenosis in the mid right posterior cerebral artery. Moderate stenosis distal left posterior cerebral artery. Mild atherosclerotic irregularity and stenosis in the cavernous carotid bilaterally. Single A2 segment. Anterior cerebral arteries patent bilaterally. Middle cerebral artery patent bilaterally. Mild irregularity and stenosis and right M2 segment. Negative for cerebral aneurysm. IMPRESSION: Intracranial atherosclerotic disease mild to moderate in degree. No large vessel occlusion. Electronically Signed   By: Marlan Palau M.D.   On: 05/12/2016 08:29   Mr Brain Wo Contrast  Result Date: 05/17/2016 CLINICAL DATA:  81 y/o  M; stroke for follow-up. EXAM: MRI HEAD WITHOUT CONTRAST TECHNIQUE: Multiplanar, multiecho pulse sequences of the brain and surrounding structures were obtained without intravenous contrast. COMPARISON:  05/15/2016 CT head.  05/12/2016 MRI head. FINDINGS: Brain: In comparison with prior MRI of the brain there are multiple new foci of diffusion restriction most pronounced in the right superior parietal lobe and the bilateral occipital lobes, but new foci are present throughout the hemispheres bilaterally. Foci of diffusion restriction are associated with T2 FLAIR hyperintensity. Additionally there is a  background of advanced chronic microvascular ischemic change and parenchymal volume loss of the brain. There are several small chronic lacunar infarcts in the cerebellar hemispheres bilaterally and within the right basal ganglia extending into corona radiata. Stable subcentimeter subacute hematoma in the right posterior frontal region. Few additional punctate foci of susceptibility hypointensity are present in the brain for example in the left posterior temporal lobe compatible sequelae of old hemorrhage. No new susceptibility hypointensity is identified to suggest interval intracranial hemorrhage. No extra-axial collection or hydrocephalus. Vascular: Central flow voids are maintained. Skull and upper cervical spine: Normal marrow signal. Sinuses/Orbits: Negative. Other: None. IMPRESSION: 1. Interval development of multiple new foci of diffusion restriction throughout the brain compatible with new acute infarcts. New infarcts are in greatest concentration within the left high parietal lobe and bilateral occipital lobes. The pattern is most consistent with new cerebral emboli. 2. Stable subacute hemorrhage in the right posterior frontal region. No new hemorrhage identified. These results will be called to the ordering clinician or representative by the Radiologist Assistant, and communication documented in the PACS or zVision Dashboard. Electronically Signed   By: Mitzi Hansen M.D.   On: 05/17/2016 18:02   Mr Brain Wo Contrast  Result Date: 05/12/2016 CLINICAL DATA:  Altered mental status. Confusion. Stroke. Hypertension. EXAM: MRI HEAD WITHOUT CONTRAST TECHNIQUE: Multiplanar, multiecho pulse sequences of the brain and surrounding structures were obtained without intravenous contrast. COMPARISON:  CT head 05/11/2016 FINDINGS: Brain: Numerous small areas of acute infarct bilaterally. These are present throughout the frontal parietal cortex. The largest area is in the right parietal cortex. Small acute  infarct in the pons. Multiple small areas of acute infarct in the cerebellum bilaterally. 6 x 10 mm hemorrhage in the right parietal white matter. This is high density on CT and appears acute and related to acute infarction. Other areas of chronic hemorrhage include the left temporal parietal lobe and right basal ganglia. Negative for mass or edema. Chronic microvascular ischemic change in the white matter, right greater than left Generalized moderate  atrophy.  Negative for hydrocephalus. Vascular: Normal arterial flow voids. Skull and upper cervical spine: Negative Sinuses/Orbits: Mild mucosal edema in the paranasal sinuses. Left lens replacement. No orbital mass. Other: None IMPRESSION: Numerous small areas of acute cerebral infarction throughout both cerebral hemispheres and cerebellum bilaterally. Pattern most consistent with cerebral emboli. 1 cm area of acute hemorrhage in the right parietal white matter as identified on CT. Electronically Signed   By: Marlan Palau M.D.   On: 05/12/2016 08:34   US Carotid Bilateral  Result Date: 05/12/2016 CLINICAL DATA:  CVA EXAM: BILATERAL CAROTID DUPLEX ULTRASOUND TECHNIQUE: Wallace Cullens scale imaging, color Doppler and duplex ultrasound were performed of bilateral carotid and vertebral arteries in the neck. COMPARISON:  None. FINDINGS: Criteria: Quantification of carotid stenosis is based on velocity parameters that correlate the residual internal carotid diameter with NASCET-based stenosis levels, using the diameter of the distal internal carotid lumen as the denominator for stenosis measurement. The following velocity measurements were obtained: RIGHT ICA:  42 cm/sec CCA:  64 cm/sec SYSTOLIC ICA/CCA RATIO:  0.7 DIASTOLIC ICA/CCA RATIO:  1.0 ECA:  96 cm/sec LEFT ICA:  38 cm/sec CCA:  79 cm/sec SYSTOLIC ICA/CCA RATIO:  0.5 DIASTOLIC ICA/CCA RATIO:  1.1 ECA:  50 cm/sec RIGHT CAROTID ARTERY: Mild calcified plaque in the bulb. Low resistance internal carotid Doppler pattern.  RIGHT VERTEBRAL ARTERY:  Antegrade. LEFT CAROTID ARTERY: Mild calcified plaque in the bulb. Low resistance internal carotid Doppler pattern. LEFT VERTEBRAL ARTERY:  Antegrade. IMPRESSION: Less than 50% stenosis in the right and left internal carotid arteries. Mild calcified plaque is noted in both bulbs. Electronically Signed   By: Jolaine Click M.D.   On: 05/12/2016 08:40    Micro Results    Recent Results (from the past 240 hour(s))  Culture, Urine     Status: Abnormal   Collection Time: 05/16/16  4:44 PM  Result Value Ref Range Status   Specimen Description URINE, CLEAN CATCH  Final   Special Requests NONE  Final   Culture (A)  Final    >=100,000 COLONIES/mL KLEBSIELLA OXYTOCA >=100,000 COLONIES/mL MORGANELLA MORGANII 60,000 COLONIES/mL ESCHERICHIA COLI    Report Status 05/20/2016 FINAL  Final   Organism ID, Bacteria KLEBSIELLA OXYTOCA (A)  Final   Organism ID, Bacteria MORGANELLA MORGANII (A)  Final   Organism ID, Bacteria ESCHERICHIA COLI (A)  Final      Susceptibility   Escherichia coli - MIC*    AMPICILLIN 4 SENSITIVE Sensitive     CEFAZOLIN <=4 SENSITIVE Sensitive     CEFTRIAXONE <=1 SENSITIVE Sensitive     CIPROFLOXACIN 1 SENSITIVE Sensitive     GENTAMICIN <=1 SENSITIVE Sensitive     IMIPENEM <=0.25 SENSITIVE Sensitive     NITROFURANTOIN <=16 SENSITIVE Sensitive     TRIMETH/SULFA 40 SENSITIVE Sensitive     AMPICILLIN/SULBACTAM <=2 SENSITIVE Sensitive     PIP/TAZO <=4 SENSITIVE Sensitive     Extended ESBL NEGATIVE Sensitive     * 60,000 COLONIES/mL ESCHERICHIA COLI   Klebsiella oxytoca - MIC*    AMPICILLIN >=32 RESISTANT Resistant     CEFAZOLIN 8 SENSITIVE Sensitive     CEFTRIAXONE <=1 SENSITIVE Sensitive     CIPROFLOXACIN <=0.25 SENSITIVE Sensitive     GENTAMICIN <=1 SENSITIVE Sensitive     IMIPENEM <=0.25 SENSITIVE Sensitive     NITROFURANTOIN 32 SENSITIVE Sensitive     TRIMETH/SULFA <=20 SENSITIVE Sensitive     AMPICILLIN/SULBACTAM 16 INTERMEDIATE Intermediate       PIP/TAZO <=4 SENSITIVE Sensitive  Extended ESBL NEGATIVE Sensitive     * >=100,000 COLONIES/mL KLEBSIELLA OXYTOCA   Morganella morganii - MIC*    AMPICILLIN >=32 RESISTANT Resistant     CEFAZOLIN >=64 RESISTANT Resistant     CEFTRIAXONE <=1 SENSITIVE Sensitive     CIPROFLOXACIN <=0.25 SENSITIVE Sensitive     GENTAMICIN <=1 SENSITIVE Sensitive     IMIPENEM 1 SENSITIVE Sensitive     NITROFURANTOIN 64 RESISTANT Resistant     TRIMETH/SULFA <=20 SENSITIVE Sensitive     AMPICILLIN/SULBACTAM >=32 RESISTANT Resistant     PIP/TAZO <=4 SENSITIVE Sensitive     * >=100,000 COLONIES/mL MORGANELLA MORGANII    Today   Subjective    Bry[place]on Grape today has no headache,no chest abdominal pain,no new weakness tingling or numbness, feels much better wants to go home today.     Objective   Blood pressure (!) 141/64, pulse 74, temperature 98.6 F (37 C), temperature source Oral, resp. rate 20, height 6' (1.829 m), weight 70.2 kg (154 lb 11.2 oz), SpO2 97 %.   Intake/Output Summary (Last 24 hours) at 05/22/16 0933 Last data filed at 05/22/16 0500  Gross per 24 hour  Intake              243 ml  Output              600 ml  Net             -357 ml    Exam Awake Alert, Oriented x 3, No new F.N deficits, Normal affect [place].AT,PERRAL Supple Neck,No JVD, No cervical lymphadenopathy appriciated.  Symmetrical Chest wall movement, Good air movement bilaterally, CTAB RRR,No Gallops,Rubs or new Murmurs, No Parasternal Heave +ve B.Sounds, Abd Soft, Non tender, No organomegaly appriciated, No rebound -guarding or rigidity. No Cyanosis, Clubbing or edema, No new Rash or bruise   Data Review   CBC w Diff:  Lab Results  Component Value Date   WBC 7.2 05/17/2016   HGB 13.6 05/17/2016   HCT 40.9 05/17/2016   PLT 57 (L) 05/17/2016    CMP:  Lab Results  Component Value Date   NA 142 05/20/2016   K 3.9 05/21/2016   CL 115 (H) 05/20/2016   CO2 19 (L) 05/20/2016   BUN 46 (H) 05/20/2016    CREATININE 1.09 05/20/2016   PROT 7.0 05/11/2016   ALBUMIN 3.6 05/11/2016   BILITOT 0.7 05/11/2016   ALKPHOS 367 (H) 05/11/2016   AST 22 05/11/2016   ALT 8 (L) 05/11/2016  .   Total Time in preparing paper work, data evaluation and todays exam - 35 minutes  Leroy Sea M.D on 05/22/2016 at 9:33 AM  [facility]   Office  [phone]

## 2016-05-23 ENCOUNTER — Encounter (HOSPITAL_COMMUNITY): Payer: Self-pay | Admitting: Ophthalmology

## 2016-06-09 DEATH — deceased
